# Patient Record
Sex: Female | Born: 1960
Health system: Southern US, Community
[De-identification: ages and names within clinical notes are randomized; demographics above are authoritative.]

## PROBLEM LIST (undated history)

## (undated) DIAGNOSIS — C50919 Malignant neoplasm of unspecified site of unspecified female breast: Secondary | ICD-10-CM

## (undated) DIAGNOSIS — Z9889 Other specified postprocedural states: Secondary | ICD-10-CM

## (undated) DIAGNOSIS — R112 Nausea with vomiting, unspecified: Secondary | ICD-10-CM

## (undated) DIAGNOSIS — I10 Essential (primary) hypertension: Secondary | ICD-10-CM

## (undated) DIAGNOSIS — E0500 Thyrotoxicosis with diffuse goiter without thyrotoxic crisis or storm: Secondary | ICD-10-CM

## (undated) DIAGNOSIS — N809 Endometriosis, unspecified: Secondary | ICD-10-CM

## (undated) DIAGNOSIS — Z8709 Personal history of other diseases of the respiratory system: Secondary | ICD-10-CM

## (undated) DIAGNOSIS — Z803 Family history of malignant neoplasm of breast: Principal | ICD-10-CM

## (undated) DIAGNOSIS — Z923 Personal history of irradiation: Secondary | ICD-10-CM

## (undated) DIAGNOSIS — E785 Hyperlipidemia, unspecified: Secondary | ICD-10-CM

## (undated) DIAGNOSIS — G43909 Migraine, unspecified, not intractable, without status migrainosus: Secondary | ICD-10-CM

## (undated) DIAGNOSIS — Z1401 Asymptomatic hemophilia A carrier: Secondary | ICD-10-CM

## (undated) DIAGNOSIS — Z1379 Encounter for other screening for genetic and chromosomal anomalies: Principal | ICD-10-CM

## (undated) HISTORY — DX: Family history of malignant neoplasm of breast: Z80.3

## (undated) HISTORY — DX: Personal history of other diseases of the respiratory system: Z87.09

## (undated) HISTORY — DX: Endometriosis, unspecified: N80.9

## (undated) HISTORY — DX: Migraine, unspecified, not intractable, without status migrainosus: G43.909

## (undated) HISTORY — DX: Essential (primary) hypertension: I10

## (undated) HISTORY — DX: Malignant neoplasm of unspecified site of unspecified female breast: C50.919

## (undated) HISTORY — PX: ULNAR TUNNEL RELEASE: SHX820

## (undated) HISTORY — DX: Hyperlipidemia, unspecified: E78.5

## (undated) HISTORY — DX: Encounter for other screening for genetic and chromosomal anomalies: Z13.79

## (undated) HISTORY — PX: INCISIONAL HERNIA REPAIR: SHX193

## (undated) HISTORY — DX: Thyrotoxicosis with diffuse goiter without thyrotoxic crisis or storm: E05.00

## (undated) HISTORY — DX: Asymptomatic hemophilia A carrier: Z14.01

## (undated) HISTORY — DX: Personal history of irradiation: Z92.3

---

## 1986-05-03 HISTORY — PX: VAGINAL HYSTERECTOMY: SUR661

## 1989-05-03 HISTORY — PX: ANTERIOR CRUCIATE LIGAMENT REPAIR: SHX115

## 1991-05-04 HISTORY — PX: MENISCUS REPAIR: SHX5179

## 1998-02-05 ENCOUNTER — Emergency Department (HOSPITAL_COMMUNITY): Admission: EM | Admit: 1998-02-05 | Discharge: 1998-02-06 | Payer: Self-pay | Admitting: Emergency Medicine

## 1998-02-06 ENCOUNTER — Encounter: Payer: Self-pay | Admitting: Emergency Medicine

## 1998-02-06 ENCOUNTER — Ambulatory Visit (HOSPITAL_COMMUNITY): Admission: RE | Admit: 1998-02-06 | Discharge: 1998-02-06 | Payer: Self-pay | Admitting: Emergency Medicine

## 1999-05-04 HISTORY — PX: CHOLECYSTECTOMY, LAPAROSCOPIC: SHX56

## 1999-05-04 HISTORY — PX: INCISIONAL HERNIA REPAIR: SHX193

## 2004-11-16 ENCOUNTER — Encounter: Admission: RE | Admit: 2004-11-16 | Discharge: 2004-11-16 | Payer: Self-pay | Admitting: Sports Medicine

## 2005-09-13 IMAGING — CT CT ABDOMEN W/O CM
1 series · 15 of 32 positions shown, 19 images · non-contrast
Comparison: none

CLINICAL DATA: Right lower quadrant and right flank pain.
 CT OF THE ABDOMEN WITHOUT CONTRAST ? STONE PROTOCOL:
TECHNIQUE: Multidetector helical study performed without IV and without oral contrast for the renal stone protocol.
TECHNIQUE: Multidetector CT imaging of the pelvis was performed following the standard protocol without IV contrast.

[Series 2: renal stone · axial · 0.70mm/px · z∈[-358,-38]mm · 15 of 72 slices shown, 19 images]
[im 5/72  soft-tissue]
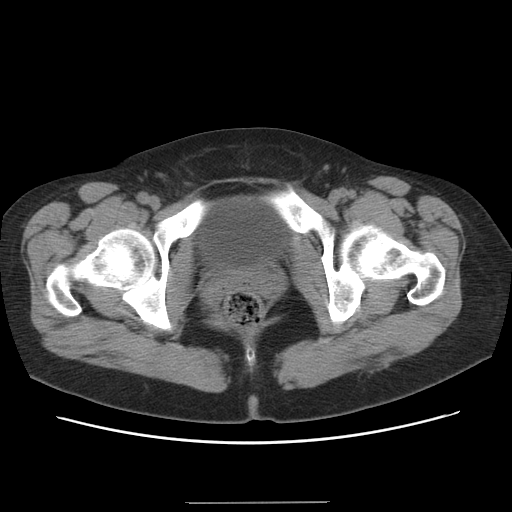
[im 5/72  bone]
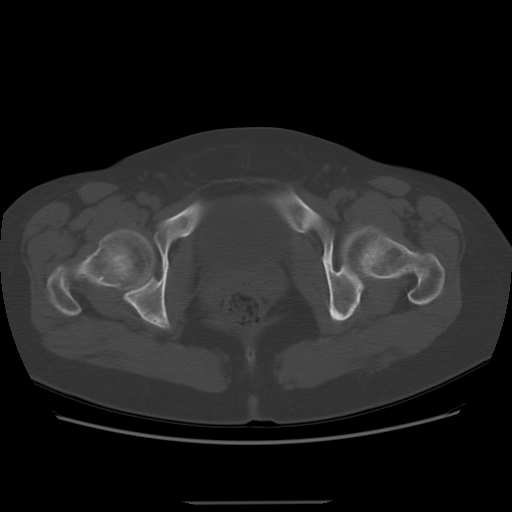
[im 10/72  soft-tissue]
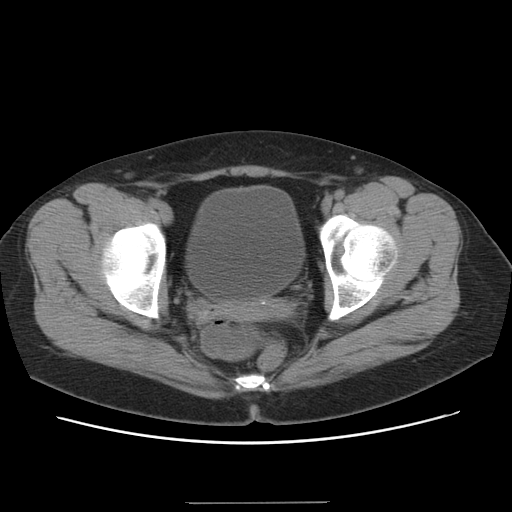
[im 14/72  soft-tissue]
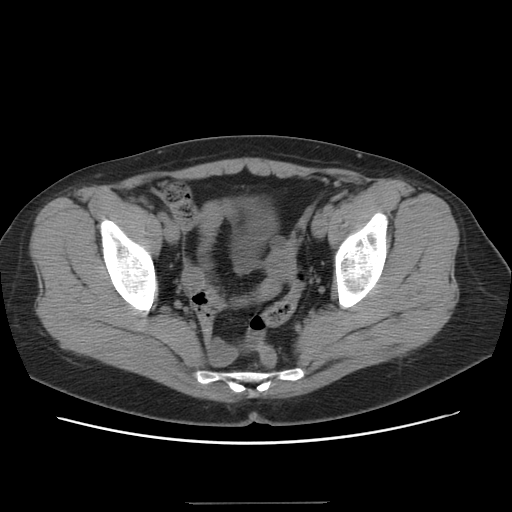
[im 21/72  soft-tissue]
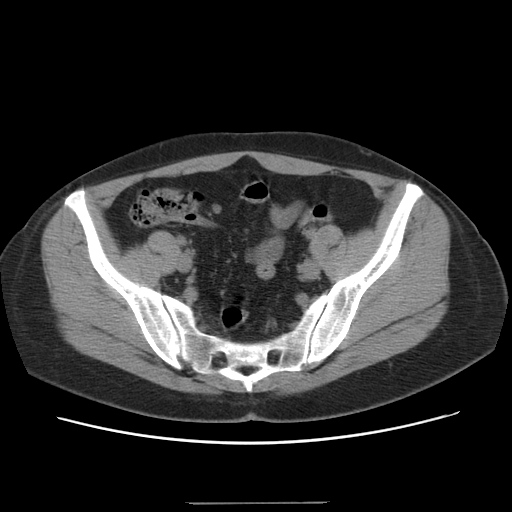
[im 26/72  soft-tissue]
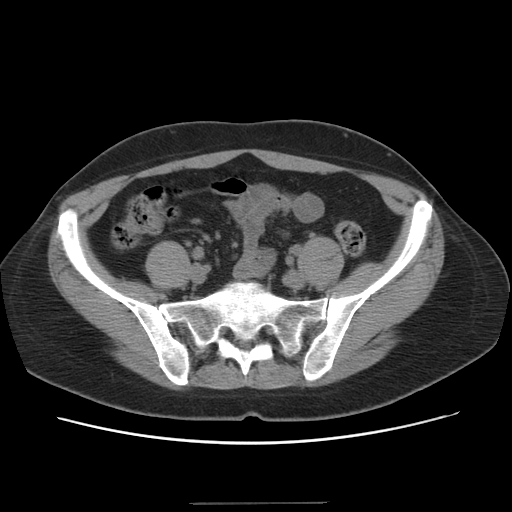
[im 30/72  soft-tissue]
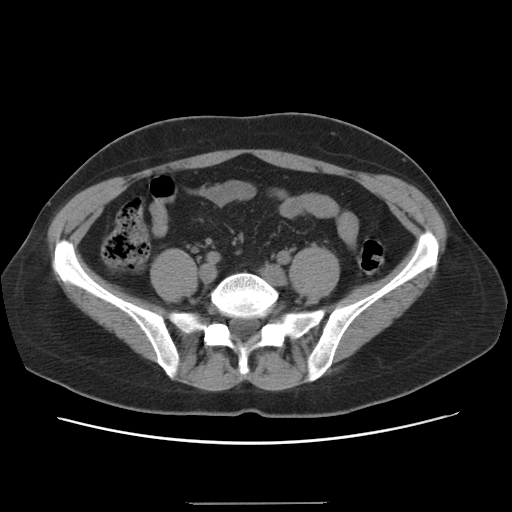
[im 37/72  soft-tissue]
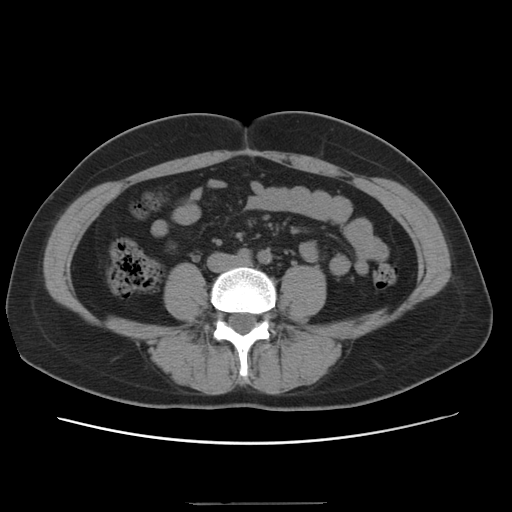
[im 42/72  soft-tissue]
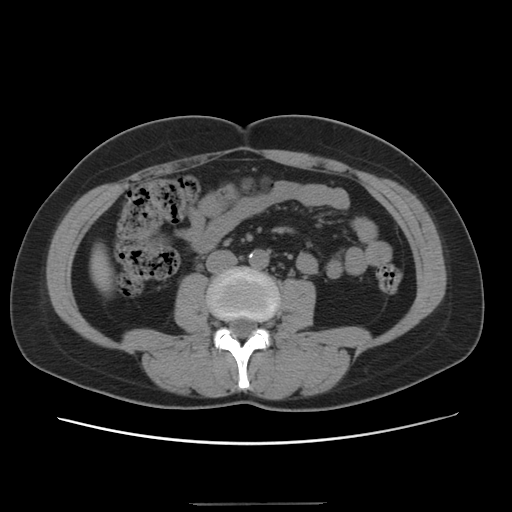
[im 46/72  soft-tissue]
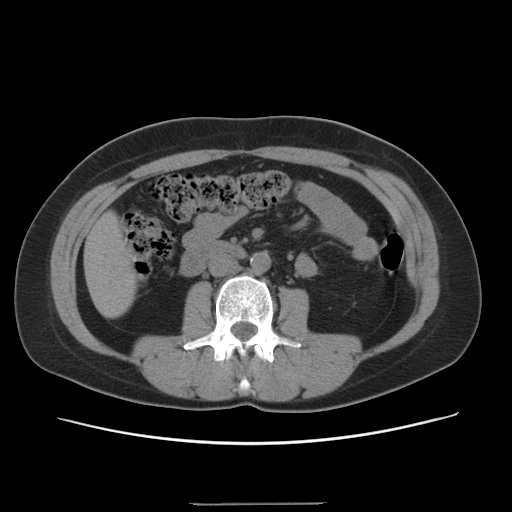
[im 46/72  bone]
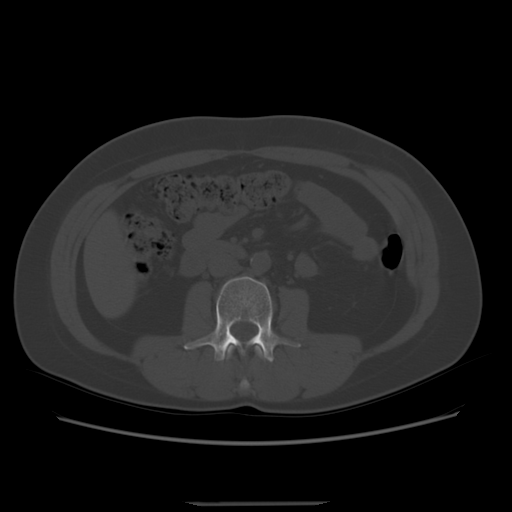
[im 51/72  soft-tissue]
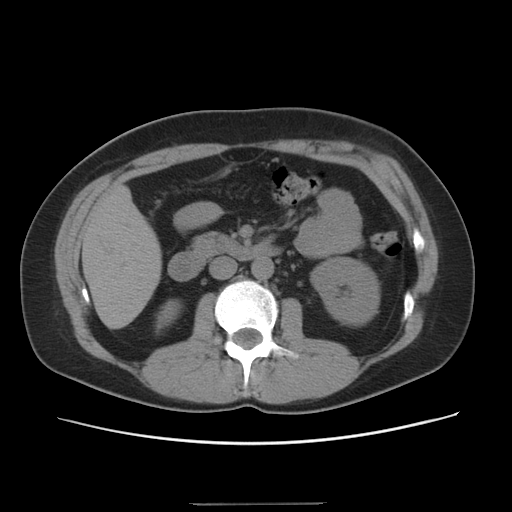
[im 58/72  soft-tissue]
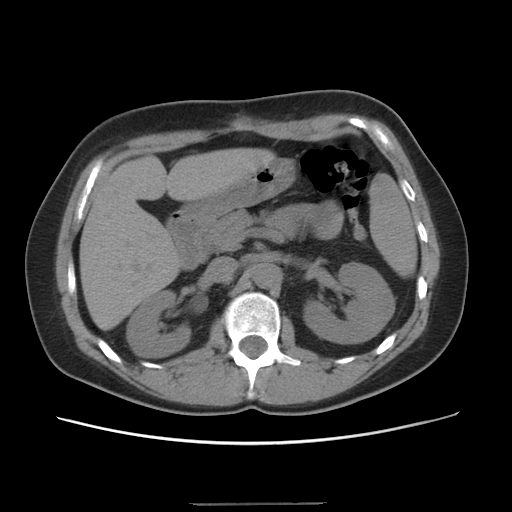
[im 62/72  soft-tissue]
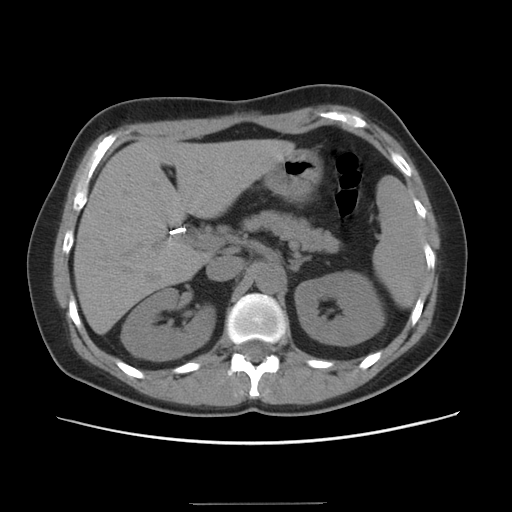
[im 62/72  lung]
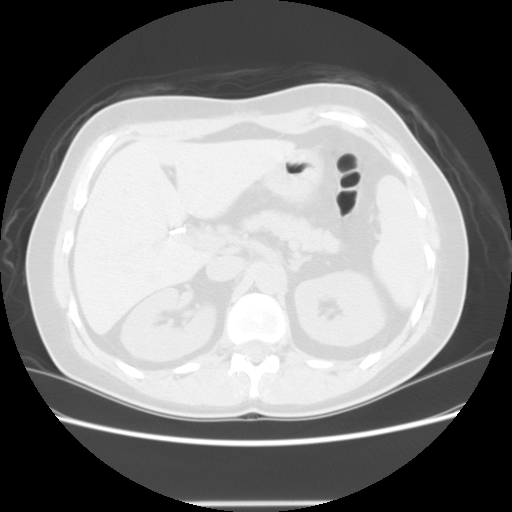
[im 65/72  lung]
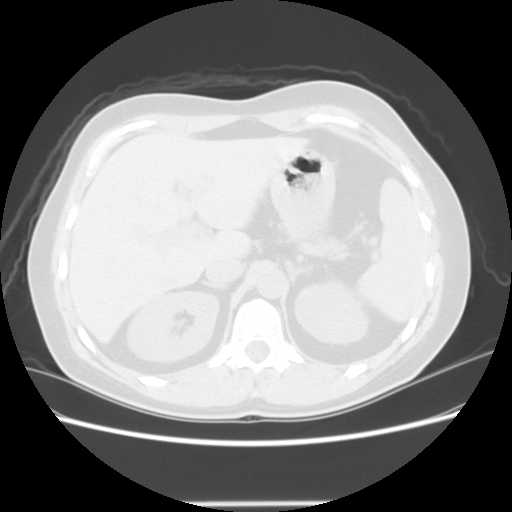
[im 67/72  soft-tissue]
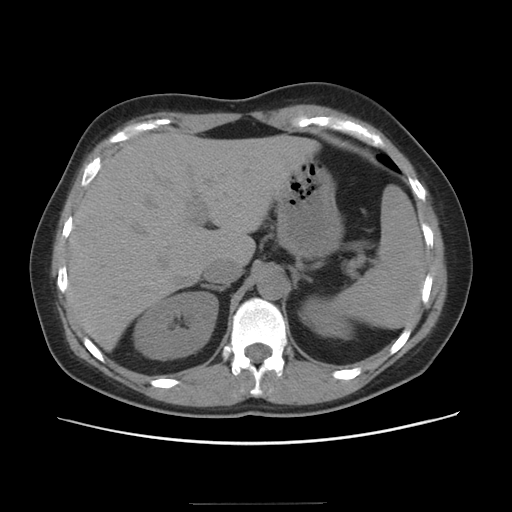
[im 67/72  lung]
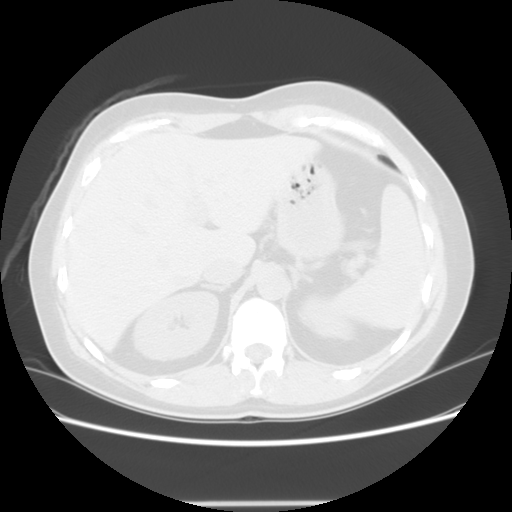
[im 69/72  lung]
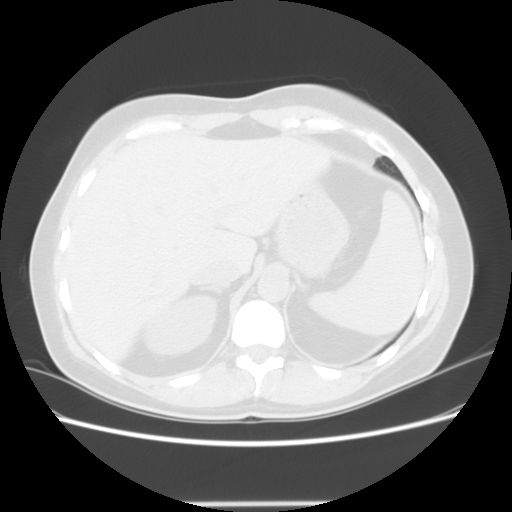

[15 of 32 positions shown; findings below may reference images not displayed]

FINDINGS: There are no infrarenal calculi and there is no hydronephrosis.  There are no perinephric edematous changes.  The gallbladder has been previously removed.  The visualized portions of the liver, spleen, pancreas and adrenal glands have a normal appearance.
IMPRESSION: Previous cholecystectomy.  No evidence for renal or ureteral calculi and no hydronephrosis. 
 CT OF THE PELVIS WITHOUT CONTRAST ? STONE PROTOCOL:
FINDINGS: There is a 2.1 cm in size right ovarian cyst noted.  There is no evidence for ureteral calculi.  There has been a previous hysterectomy.  There is no evidence for appendicitis.
IMPRESSION: 2.1 cm in size right ovarian cyst.  Previous hysterectomy.  Otherwise negative CT of the pelvis.

## 2011-08-24 LAB — HM MAMMOGRAPHY: HM Mammogram: NORMAL

## 2012-06-23 ENCOUNTER — Encounter: Payer: Self-pay | Admitting: Obstetrics and Gynecology

## 2012-07-04 ENCOUNTER — Encounter: Payer: Self-pay | Admitting: Obstetrics and Gynecology

## 2012-08-02 ENCOUNTER — Encounter: Payer: Self-pay | Admitting: Obstetrics and Gynecology

## 2012-08-02 DIAGNOSIS — Z90710 Acquired absence of both cervix and uterus: Secondary | ICD-10-CM | POA: Insufficient documentation

## 2012-08-02 DIAGNOSIS — Z1401 Asymptomatic hemophilia A carrier: Secondary | ICD-10-CM

## 2012-08-02 DIAGNOSIS — I10 Essential (primary) hypertension: Secondary | ICD-10-CM | POA: Insufficient documentation

## 2012-08-02 DIAGNOSIS — K219 Gastro-esophageal reflux disease without esophagitis: Secondary | ICD-10-CM

## 2012-08-04 ENCOUNTER — Ambulatory Visit: Payer: Self-pay | Admitting: Obstetrics and Gynecology

## 2012-08-07 ENCOUNTER — Other Ambulatory Visit: Payer: Self-pay | Admitting: Obstetrics and Gynecology

## 2012-08-07 NOTE — Telephone Encounter (Signed)
Annual exam 09/20/12

## 2012-08-15 ENCOUNTER — Other Ambulatory Visit: Payer: Self-pay | Admitting: Obstetrics and Gynecology

## 2012-08-16 ENCOUNTER — Telehealth: Payer: Self-pay | Admitting: *Deleted

## 2012-08-16 NOTE — Telephone Encounter (Signed)
Annual exam 09/20/12 with Dr. Tresa Res

## 2012-08-17 NOTE — Telephone Encounter (Signed)
Rx for Minivelle 0.05mg  2x weekly #8 with 1 refill sent to [facility] [place] . Pt has a AEX 09/20/12 cm

## 2012-09-20 ENCOUNTER — Encounter: Payer: Self-pay | Admitting: Obstetrics and Gynecology

## 2012-09-20 ENCOUNTER — Ambulatory Visit (INDEPENDENT_AMBULATORY_CARE_PROVIDER_SITE_OTHER): Payer: BC Managed Care – PPO | Admitting: Obstetrics and Gynecology

## 2012-09-20 VITALS — BP 110/62 | Ht 63.0 in | Wt 152.0 lb

## 2012-09-20 DIAGNOSIS — Z01419 Encounter for gynecological examination (general) (routine) without abnormal findings: Secondary | ICD-10-CM

## 2012-09-20 MED ORDER — ESTRADIOL 0.05 MG/24HR TD PTTW: 1.0000 | MEDICATED_PATCH | TRANSDERMAL | Status: DC

## 2012-09-20 NOTE — Progress Notes (Signed)
52 y.o.   Married    Caucasian   female   G2P2   here for annual exam.  On ERT for v-m sx relief and for mood stabilization. Stressed taking care of elderly mother.  More hot flashes.  Patch not sticking well.  May want to change to femring but wants to check price before changing.    Patient's last menstrual period was 05/03/1986.          Sexually active: yes  The current method of family planning is status post hysterectomy.    Exercising: walk 1-3 times a week Last mammogram:  08/24/11 benign Last pap smear:1996 History of abnormal pap: no Smoking:1-2 cigarettes a day Alcohol:no Last colonoscopy:never encouraged to go. Last Bone Density:  never Last tetanus shot: less than 10 years Last cholesterol check: 2013 elevated  Hgb:      pcp          Urine:pcp   Family History  Problem Relation Age of Onset  . Breast cancer Mother 73  . Diabetes Mellitus I Mother   . Breast cancer Maternal Aunt 56  . Heart attack Father     Patient Active Problem List   Diagnosis Date Noted  . H/O total vaginal hysterectomy, endometriosis 1988 08/02/2012  . GERD (gastroesophageal reflux disease) 08/02/2012  . 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 Hypertension     Past Medical History  Diagnosis Date  . Graves disease   . Personal history of asthma   . GERD (gastroesophageal reflux disease)   . 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 Hypertension   . Endometriosis     Past Surgical History  Procedure Laterality Date  . Vaginal hysterectomy  1988    for endometriosos  . Cholecystectomy, laparoscopic  2001  . Incisional hernia repair  2001    from lap chole  . Ulnar tunnel release Right     Allergies: Review of patient's allergies indicates no known allergies.  Current Outpatient Prescriptions  Medication Sig Dispense Refill  . hydrochlorothiazide (HYDRODIURIL) 25 MG tablet Take 25 mg by mouth daily.      . methimazole (TAPAZOLE) 10 MG tablet Take 10 mg by mouth daily.      . metoprolol succinate  (TOPROL-XL) 25 MG 24 hr tablet Take 25 mg by mouth daily.      Marland Kitchen MINIVELLE 0.05 MG/24HR APPLY 1 PATCH TWICE WEEKKY  8 patch  1  . rosuvastatin (CRESTOR) 10 MG tablet Take 10 mg by mouth daily. Dose uncertain      . topiramate (TOPAMAX) 25 MG tablet Take 25 mg by mouth daily. Frequency uncertain      . ZETIA 10 MG tablet        No current facility-administered medications for this visit.    ROS: Pertinent items are noted in HPI.  Social Hx: married, two children, works as an Contractor  Exam:    BP 110/62  Ht 5\' 3"  (1.6 m)  Wt 152 lb (68.947 kg)  BMI 26.93 kg/m2  LMP 05/03/1986  Ht stable and wt up 8 pounds from last year Wt Readings from Last 3 Encounters:  09/20/12 152 lb (68.947 kg)     Ht Readings from Last 3 Encounters:  09/20/12 5\' 3"  (1.6 m)    General appearance: alert, cooperative and appears stated age Head: Normocephalic, without obvious abnormality, atraumatic Neck: no adenopathy, supple, symmetrical, trachea midline and thyroid not enlarged, symmetric, no tenderness/mass/nodules Lungs: clear to auscultation bilaterally Breasts: Inspection negative, No nipple retraction or dimpling,  No nipple discharge or bleeding, No axillary or supraclavicular adenopathy, Normal to palpation without dominant masses Heart: regular rate and rhythm Abdomen: soft, non-tender; bowel sounds normal; no masses,  no organomegaly Extremities: extremities normal, atraumatic, no cyanosis or edema Skin: Skin color, texture, turgor normal. No rashes or lesions Lymph nodes: Cervical, supraclavicular, and axillary nodes normal. No abnormal inguinal nodes palpated Neurologic: Grossly normal   Pelvic: External genitalia:  no lesions              Urethra:  normal appearing urethra with no masses, tenderness or lesions              Bartholins and Skenes: normal                 Vagina: normal appearing vagina with normal color and discharge, no lesions              Cervix: absent               Pap taken: no        Bimanual Exam:  Uterus:  absent                                      Adnexa: normal adnexa in size, nontender and no masses                                      Rectovaginal: Confirms                                      Anus:  normal sphincter tone, no lesions  A: normal  menopausal exam, on ERT     S/p TVH 1988 for endometriosis - stopped P4 tx after discussion re breast ca risks in 2012     FH breast cancer in Mother and mat aunt, both post menopausal     HTN, elevated Cholesterol     P: mammogram counseled on breast self exam, mammography screening, adequate intake of calcium and vitamin D, diet and exercise return annually or prn     An After Visit Summary was printed and given to the patient.

## 2012-09-20 NOTE — Patient Instructions (Signed)

## 2012-11-14 ENCOUNTER — Telehealth: Payer: Self-pay | Admitting: *Deleted

## 2012-11-14 NOTE — Telephone Encounter (Signed)
Per Dr. Tresa Res. Mammogram out of hold/ 11/10/2012/ sue

## 2012-11-23 ENCOUNTER — Telehealth: Payer: Self-pay | Admitting: Obstetrics and Gynecology

## 2012-11-23 NOTE — Telephone Encounter (Signed)
Patient finishing up an rx for a sinus infection and thinks she may have a urinary tract infection. Patient declines appointment and hopes we will call something into [facility] for her.

## 2012-11-23 NOTE — Telephone Encounter (Signed)
Spoke with patient about her symptoms, she thinks  it might be a yeast infection not sure. I explained to her That we would need to see her in the office can not treat over the phone. She was ok with is will need to clear It with her employer. appt scheduled for 11/24/12 w/ Dr. Tresa Res. cm

## 2012-11-24 ENCOUNTER — Ambulatory Visit: Payer: BC Managed Care – PPO | Admitting: Obstetrics and Gynecology

## 2012-11-24 NOTE — Telephone Encounter (Signed)
Patient called and is unable to make the appointment for today due to [employer] at work. Did not count as DNKA. Patient does want to speak with nurse please for advice for the weekend please.

## 2012-11-24 NOTE — Telephone Encounter (Signed)
Returned call to patient who is at work. Stated she can not come in  Today due to work schedule. States she will call the doctor who Rx the antibiotic and see if they will call something in . Patient did not know the name of the antibiotic or how long she had been on it. States will call back on Monday if still has symptoms.

## 2012-12-08 ENCOUNTER — Telehealth: Payer: Self-pay | Admitting: *Deleted

## 2012-12-08 NOTE — Telephone Encounter (Signed)
Placed in 6 month recall / 04 mammogram per Patty Grubb./sue

## 2013-09-25 ENCOUNTER — Other Ambulatory Visit: Payer: Self-pay

## 2013-09-25 MED ORDER — ESTRADIOL 0.05 MG/24HR TD PTTW: 1.0000 | MEDICATED_PATCH | TRANSDERMAL | Status: DC

## 2013-09-25 NOTE — Telephone Encounter (Signed)
Call to patient-needs AEX-scheduled for 10/25/13 with Dr Sabra Heck, MMG was done 11/01/12 with additional views on 11/07/13, recommended diag mmg in one year//kn

## 2013-10-25 ENCOUNTER — Ambulatory Visit: Payer: BC Managed Care – PPO | Admitting: Obstetrics & Gynecology

## 2013-11-12 ENCOUNTER — Encounter: Payer: Self-pay | Admitting: Obstetrics & Gynecology

## 2013-11-12 ENCOUNTER — Ambulatory Visit (INDEPENDENT_AMBULATORY_CARE_PROVIDER_SITE_OTHER): Payer: BC Managed Care – PPO | Admitting: Obstetrics & Gynecology

## 2013-11-12 VITALS — BP 128/78 | HR 64 | Resp 12 | Ht 63.0 in | Wt 165.8 lb

## 2013-11-12 DIAGNOSIS — Z1211 Encounter for screening for malignant neoplasm of colon: Secondary | ICD-10-CM

## 2013-11-12 DIAGNOSIS — Z0000 Encounter for general adult medical examination without abnormal findings: Secondary | ICD-10-CM

## 2013-11-12 DIAGNOSIS — Z01419 Encounter for gynecological examination (general) (routine) without abnormal findings: Secondary | ICD-10-CM

## 2013-11-12 LAB — POCT URINALYSIS DIPSTICK
Bilirubin, UA: NEGATIVE
Blood, UA: NEGATIVE
Glucose, UA: NEGATIVE
KETONES UA: NEGATIVE
LEUKOCYTES UA: NEGATIVE
Nitrite, UA: NEGATIVE
PH UA: 5
PROTEIN UA: NEGATIVE
UROBILINOGEN UA: NEGATIVE

## 2013-11-12 MED ORDER — ESTRADIOL 0.05 MG/24HR TD PTTW: 1.0000 | MEDICATED_PATCH | TRANSDERMAL | Status: DC

## 2013-11-12 NOTE — Progress Notes (Signed)
53 y.o. G2P2 MarriedCaucasianF here for annual exam.  No vaginal bleeding.  Works in [employer].  Saw Dr. Forde Dandy last week.  Going every four months.  Having a lot of issues with this.  H/O grave's disease.  Thyroid function now low and she is on synthroid.    Patient's last menstrual period was 05/03/1986.          Sexually active: Yes.    The current method of family planning is status post hysterectomy.    Exercising: No.  not regularly Smoker:  Former smoker  Health Maintenance: Pap:  Years ago History of abnormal Pap:  no MMG:  11/01/12 3D MMG, 11/07/12 additional views-diag one year Colonoscopy:  none BMD:   none TDaP:  Up to date with work Screening Labs: Dr. Forde Dandy, Hb today: 14.8, Urine today: negative   reports that she has quit smoking. Her smoking use included Cigarettes. She smoked 0.00 packs per day. She has never used smokeless tobacco. She reports that she does not drink alcohol or use illicit drugs.  Past Medical History  Diagnosis Date  . Graves disease   . Personal history of asthma   . GERD (gastroesophageal reflux disease)   . 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 Hypertension   . Endometriosis   . Migraine     Past Surgical History  Procedure Laterality Date  . Vaginal hysterectomy  1988    for endometriosos  . Cholecystectomy, laparoscopic  2001  . Incisional hernia repair  2001    from lap chole  . Ulnar tunnel release Right     Current Outpatient Prescriptions  Medication Sig Dispense Refill  . estradiol (MINIVELLE) 0.05 MG/24HR patch Place 1 patch (0.05 mg total) onto the skin 2 (two) times a week.  8 patch  1  . hydrochlorothiazide (HYDRODIURIL) 25 MG tablet Take 25 mg by mouth daily.      . metoprolol succinate (TOPROL-XL) 25 MG 24 hr tablet Take 25 mg by mouth daily.      . rosuvastatin (CRESTOR) 10 MG tablet Take 10 mg by mouth daily. Dose uncertain      . Topiramate ER (TROKENDI XR) 25 MG CP24 Take 25 mg by mouth daily.      Marland Kitchen ZETIA 10 MG tablet        . levothyroxine (SYNTHROID, LEVOTHROID) 75 MCG tablet Take 75 mcg by mouth.       No current facility-administered medications for this visit.    Family History  Problem Relation Age of Onset  . Breast cancer Mother 76  . Diabetes Mellitus I Mother   . Breast cancer Maternal Aunt 56  . Heart attack Father     ROS:  Pertinent items are noted in HPI.  Otherwise, a comprehensive ROS was negative.  Exam:   BP 128/78  Pulse 64  Resp 12  Ht 5\' 3"  (1.6 m)  Wt 165 lb 12.8 oz (75.206 kg)  BMI 29.38 kg/m2  LMP 05/03/1986  Height: 5\' 3"  (160 cm)  Ht Readings from Last 3 Encounters:  11/12/13 5\' 3"  (1.6 m)  09/20/12 5\' 3"  (1.6 m)    General appearance: alert, cooperative and appears stated age Head: Normocephalic, without obvious abnormality, atraumatic Neck: no adenopathy, supple, symmetrical, trachea midline and thyroid normal to inspection and palpation Lungs: clear to auscultation bilaterally Breasts: normal appearance, no masses or tenderness Heart: regular rate and rhythm Abdomen: soft, non-tender; bowel sounds normal; no masses,  no organomegaly, small mobile 3cm lesion just under surface  of skin c/w lipoma Extremities: extremities normal, atraumatic, no cyanosis or edema Skin: Skin color, texture, turgor normal. No rashes or lesions Lymph nodes: Cervical, supraclavicular, and axillary nodes normal. No abnormal inguinal nodes palpated Neurologic: Grossly normal   Pelvic: External genitalia:  no lesions              Urethra:  normal appearing urethra with no masses, tenderness or lesions              Bartholins and Skenes: normal                 Vagina: normal appearing vagina with normal color and discharge, no lesions              Cervix: absent              Pap taken: No. Bimanual Exam:  Uterus:  uterus absent              Adnexa: normal adnexa and no mass, fullness, tenderness               Rectovaginal: Confirms               Anus:  normal sphincter tone, no  lesions  A:  Well Woman with normal exam S/p TVH due to endometriosis Hypertension Elevated lipids H/O graves disease, now hypothyroidism Abdominal wall lipoma below umbilicus  P:   Mammogram due.  Pt aware.  Will schedule. Declines colonoscopy.  IFOB given. Labs with Dr. Forde Dandy.  Has follow up scheduled. pap smear not indicated return annually or prn  An After Visit Summary was printed and given to the patient.

## 2013-11-13 LAB — HEMOGLOBIN, FINGERSTICK: Hemoglobin, fingerstick: 14.8 g/dL (ref 12.0–16.0)

## 2013-12-02 ENCOUNTER — Other Ambulatory Visit: Payer: Self-pay | Admitting: Obstetrics & Gynecology

## 2013-12-03 NOTE — Telephone Encounter (Signed)
Last AEX: 11/12/13 Last refill:11/12/13 #8, 12 Current AEX:  New rx sent on 11/12/13 Encounter closed

## 2014-01-16 ENCOUNTER — Encounter: Payer: Self-pay | Admitting: Obstetrics & Gynecology

## 2014-01-16 NOTE — Progress Notes (Signed)
Letter sent to remind pt she has not returned IFOB.

## 2014-02-27 NOTE — Progress Notes (Signed)
Order cancelled for fecal occult testing as pt didn't return it even after letter sent.

## 2014-03-04 ENCOUNTER — Encounter: Payer: Self-pay | Admitting: Obstetrics & Gynecology

## 2014-11-22 ENCOUNTER — Other Ambulatory Visit: Payer: Self-pay | Admitting: Obstetrics & Gynecology

## 2014-11-22 NOTE — Telephone Encounter (Signed)
Medication refill request: Minivelle 0.05 mg  Last AEX:  11/12/13 with SM  Next AEX: 11/29/14 WITH SM Last MMG (if hormonal medication request): 11/15/13 bi-rads 2: benign Refill authorized: #8/0 rfs, please advise.

## 2014-11-29 ENCOUNTER — Ambulatory Visit: Payer: BC Managed Care – PPO | Admitting: Obstetrics & Gynecology

## 2014-12-18 ENCOUNTER — Other Ambulatory Visit: Payer: Self-pay | Admitting: Obstetrics & Gynecology

## 2014-12-18 NOTE — Telephone Encounter (Signed)
Medication refill request: Minivelle 0.05 mg  Last AEX:  11/12/13 with SM  Next AEX: 03/02/16 with SM  Last MMG (if hormonal medication request): 11/15/13 breast den category c: bi-rads 2: benign Refill authorized: Please advise  (routed to Dr. Quincy Simmonds since Dr. Sabra Heck is out of the office)

## 2014-12-18 NOTE — Telephone Encounter (Signed)
Called patient and offered to reschedule appointment, patient was offered multiple other options but patient declined due to work schedule and needing first appointment of the day only.Patient is already on waiting list and she is aware that someone will call her if appointment opens up sooner. I called patient back and left message on her voicemail in regards to scheduling mammogram.

## 2014-12-18 NOTE — Telephone Encounter (Signed)
Routed to Dr. Silva. 

## 2014-12-18 NOTE — Telephone Encounter (Signed)
I will give refill for one month.  Needs annual exam moved up and mammogram done.

## 2015-01-17 ENCOUNTER — Other Ambulatory Visit: Payer: Self-pay | Admitting: Obstetrics and Gynecology

## 2015-01-17 NOTE — Telephone Encounter (Signed)
Medication refill request: Minivelle 0.005mg  patch Last AEX:  11/12/13 SM Next AEX: 03/02/16 SM Last MMG (if hormonal medication request): 11/15/13 BIRADS2:Benign  Refill authorized: 12/18/14 #[id number] by Dr Quincy Simmonds  Refill encounter from 12/18/14:                     Alfonzo Feller, [place] at 12/18/2014 4:35 PM     Status: Signed       Expand All Collapse All   Called patient and offered to reschedule appointment, patient was offered multiple other options but patient declined due to work schedule and needing first appointment of the day only.Patient is already on waiting list and she is aware that someone will call her if appointment opens up sooner. I called patient back and left message on her voicemail in regards to scheduling mammogram.       Today please advise.

## 2015-02-19 ENCOUNTER — Other Ambulatory Visit: Payer: Self-pay | Admitting: Obstetrics & Gynecology

## 2015-02-19 NOTE — Telephone Encounter (Signed)
Medication refill request: Minivelle Patch Last AEX:  11-30-13  Next AEX: 03-03-15 Last MMG (if hormonal medication request): 12-31-14 WNL  Refill authorized: please advise

## 2015-03-18 ENCOUNTER — Other Ambulatory Visit: Payer: Self-pay | Admitting: Obstetrics & Gynecology

## 2015-03-18 ENCOUNTER — Ambulatory Visit (INDEPENDENT_AMBULATORY_CARE_PROVIDER_SITE_OTHER): Payer: BLUE CROSS/BLUE SHIELD | Admitting: Obstetrics & Gynecology

## 2015-03-18 ENCOUNTER — Encounter: Payer: Self-pay | Admitting: Obstetrics & Gynecology

## 2015-03-18 VITALS — BP 148/78 | HR 72 | Resp 16 | Ht 63.0 in | Wt 160.0 lb

## 2015-03-18 DIAGNOSIS — Z01419 Encounter for gynecological examination (general) (routine) without abnormal findings: Secondary | ICD-10-CM | POA: Diagnosis not present

## 2015-03-18 DIAGNOSIS — Z1211 Encounter for screening for malignant neoplasm of colon: Secondary | ICD-10-CM | POA: Diagnosis not present

## 2015-03-18 DIAGNOSIS — Z124 Encounter for screening for malignant neoplasm of cervix: Secondary | ICD-10-CM

## 2015-03-18 DIAGNOSIS — Z0000 Encounter for general adult medical examination without abnormal findings: Secondary | ICD-10-CM

## 2015-03-18 LAB — POCT URINALYSIS DIPSTICK
Bilirubin, UA: NEGATIVE
Blood, UA: NEGATIVE
Glucose, UA: NEGATIVE
KETONES UA: NEGATIVE
LEUKOCYTES UA: NEGATIVE
NITRITE UA: NEGATIVE
PH UA: 5
PROTEIN UA: NEGATIVE
Urobilinogen, UA: NEGATIVE

## 2015-03-18 MED ORDER — ESTRADIOL 0.05 MG/24HR TD PTTW: 1.0000 | MEDICATED_PATCH | TRANSDERMAL | Status: DC

## 2015-03-18 NOTE — Progress Notes (Signed)
54 y.o. G2P2 MarriedCaucasianF here for annual exam.  Doing well.  No vaginal bleeding.  Blood work done with Dr. Forde Dandy.  Getting results today.    Mother diagnosed with aggressive stage 3 lung cancer.  She is undergoing radiation.  Pt is having a lot of stress with this.  Pt trying to work and take mother to radiation daily.  Patient's last menstrual period was 05/03/1986.          Sexually active: Yes.    The current method of family planning is status post hysterectomy.    Exercising: No.  not regularly Smoker:  Former smoker  Health Maintenance: Pap:  ? 2010-checking with [facility] History of abnormal Pap:  no MMG:  12/31/14 BiRads 2-benign Colonoscopy:  none BMD:   none TDaP:  UTD with work Screening Labs: PCP, Hb today: PCP, Urine today: negative   reports that she has quit smoking. Her smoking use included Cigarettes. She has never used smokeless tobacco. She reports that she does not drink alcohol or use illicit drugs.  Past Medical History  Diagnosis Date  . Graves disease   . Personal history of asthma   . GERD (gastroesophageal reflux disease)   . 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 Hypertension   . Endometriosis   . Migraine     Past Surgical History  Procedure Laterality Date  . Vaginal hysterectomy  1988    for endometriosos  . Cholecystectomy, laparoscopic  2001  . Incisional hernia repair  2001    from lap chole  . Ulnar tunnel release Right     Current Outpatient Prescriptions  Medication Sig Dispense Refill  . hydrochlorothiazide (HYDRODIURIL) 25 MG tablet Take 25 mg by mouth daily.    . metoprolol succinate (TOPROL-XL) 25 MG 24 hr tablet Take 25 mg by mouth daily.    Marland Kitchen MINIVELLE 0.05 MG/24HR patch APPLY 1 PATCH ONTO THE SKIN 2 TIMES A WEEK 8 patch 0  . rosuvastatin (CRESTOR) 10 MG tablet Take 10 mg by mouth daily. Dose uncertain    . SYNTHROID 100 MCG tablet   4  . ZETIA 10 MG tablet      No current facility-administered medications for this visit.    Family  History  Problem Relation Age of Onset  . Breast cancer Mother 6  . Diabetes Mellitus I Mother   . Breast cancer Maternal Aunt 56  . Heart attack Father     ROS:  Pertinent items are noted in HPI.  Otherwise, a comprehensive ROS was negative.  Exam:   BP 148/78 mmHg  Pulse 72  Resp 16  Ht 5\' 3"  (1.6 m)  Wt 160 lb (72.576 kg)  BMI 28.35 kg/m2  LMP 05/03/1986   Height: 5\' 3"  (160 cm)  Ht Readings from Last 3 Encounters:  03/18/15 5\' 3"  (1.6 m)  11/12/13 5\' 3"  (1.6 m)  09/20/12 5\' 3"  (1.6 m)    General appearance: alert, cooperative and appears stated age Head: Normocephalic, without obvious abnormality, atraumatic Neck: no adenopathy, supple, symmetrical, trachea midline and thyroid normal to inspection and palpation Lungs: clear to auscultation bilaterally Breasts: normal appearance, no masses or tenderness Heart: regular rate and rhythm Abdomen: soft, non-tender; bowel sounds normal; no masses,  no organomegaly, about 2.5cm hernia beneath sternum, sebaceous cyst (2cm) beneath umbilicus Extremities: extremities normal, atraumatic, no cyanosis or edema Skin: Skin color, texture, turgor normal. No rashes or lesions Lymph nodes: Cervical, supraclavicular, and axillary nodes normal. No abnormal inguinal nodes palpated Neurologic: Grossly normal  Pelvic: External genitalia:  no lesions              Urethra:  normal appearing urethra with no masses, tenderness or lesions              Bartholins and Skenes: normal                 Vagina: normal appearing vagina with normal color and discharge, no lesions              Cervix: absent              Pap taken: Yes.   Bimanual Exam:  Uterus:  uterus absent              Adnexa: normal adnexa and no mass, fullness, tenderness               Rectovaginal: Confirms               Anus:  normal sphincter tone, no lesions  Chaperone was present for exam.  A:  Well Woman with normal exam S/p TVH due to  endometriosis Hypertension Elevated lipids H/O graves disease, now hypothyroidism Hernia just beneath sternum Sebaceous cyst beneath umbilicus Social stressors with mother's diagnosis  P: Mammogram yearly.  3D discussed due breast density Declines colonoscopy now due to issues with mother. IFOB given. Labs with Dr. Forde Dandy. Has appt today. Pap smear obtained. rx for Vivelle dot 0.05mg  patch to skin twice weekly.  Generic rx given.  #8/13RF.  Pt aware of risks of DVT, PE, stroke, MI, breast cancer and she DOES NOT want to stop. return annually or prn

## 2015-03-19 LAB — IPS PAP TEST WITH REFLEX TO HPV

## 2015-06-23 ENCOUNTER — Encounter: Payer: Self-pay | Admitting: Obstetrics & Gynecology

## 2015-07-04 NOTE — Progress Notes (Signed)
Pt did not return IFOB.  Letter was sent as reminder.

## 2016-01-27 ENCOUNTER — Encounter: Payer: Self-pay | Admitting: Obstetrics & Gynecology

## 2016-03-02 ENCOUNTER — Ambulatory Visit: Payer: Self-pay | Admitting: Obstetrics & Gynecology

## 2016-03-08 ENCOUNTER — Other Ambulatory Visit: Payer: Self-pay

## 2016-03-08 NOTE — Telephone Encounter (Signed)
Medication refill request: Estradiol 0.05mg  patch Last AEX:  03/18/15 SM Next AEX: 03/24/16 Last MMG (if hormonal medication request): 01/27/16 - Right diagnostic MMG BI-RADS 3 probably benign Refill authorized: 03/08/15 #8 w/13 refills; today please advise; pharmacy requesting 90-day supply

## 2016-03-10 MED ORDER — ESTRADIOL 0.05 MG/24HR TD PTTW: 1.0000 | MEDICATED_PATCH | TRANSDERMAL | 0 refills | Status: DC

## 2016-03-23 NOTE — Progress Notes (Signed)
55 y.o. G2P2 MarriedCaucasianF here for annual exam.  Denies vaginal bleeding.  Reports lots of stressors in life with care for mother.  Now daughter with three kids living with her.      PCP:  Saw Dr. Forde Dandy earlier this month.  Blood work was done.  Thyroid function is low so adjustments were made.    Patient's last menstrual period was 05/03/1986.          Sexually active: No.  The current method of family planning is status post hysterectomy.    Exercising: No.  The patient does not participate in regular exercise at present. Smoker:  no  Health Maintenance: Pap:  03/18/15 Neg  History of abnormal Pap:  no MMG:  01/14/16 Diagnostic Right BIRADS3: Probably Benign. 6 month f/u MMG recommended.  Colonoscopy: Never, stool test for blood done by Dr. Forde Dandy per pt BMD:   Never TDaP:  UTD  Pneumonia vaccine(s):  No Zostavax:   No Hep C testing:  Done today Screening Labs: PCP, Urine today:   reports that she has quit smoking. Her smoking use included Cigarettes. She has never used smokeless tobacco. She reports that she does not drink alcohol or use drugs.  Past Medical History:  Diagnosis Date  . Endometriosis   . GERD (gastroesophageal reflux disease)   . Graves disease   . 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 Hypertension   . Migraine   . Personal history of asthma     Past Surgical History:  Procedure Laterality Date  . CHOLECYSTECTOMY, LAPAROSCOPIC  2001  . [place]  2001   from lap chole  . ULNAR TUNNEL RELEASE Right   . VAGINAL HYSTERECTOMY  1988   for endometriosos    Current Outpatient Prescriptions  Medication Sig Dispense Refill  . estradiol (VIVELLE-DOT) 0.05 MG/24HR patch Place 1 patch (0.05 mg total) onto the skin 2 (two) times a week. 8 patch 0  . hydrochlorothiazide (HYDRODIURIL) 25 MG tablet Take 25 mg by mouth daily.    . metoprolol succinate (TOPROL-XL) 25 MG 24 hr tablet Take 25 mg by mouth daily.    . rosuvastatin (CRESTOR) 10 MG tablet Take 10 mg by  mouth daily. Dose uncertain    . SYNTHROID 125 MCG tablet Take 125 mcg by mouth daily.  6  . ZETIA 10 MG tablet      No current facility-administered medications for this visit.     Family History  Problem Relation Age of Onset  . Breast cancer Mother 15  . Diabetes Mellitus I Mother   . Heart attack Father   . Breast cancer Maternal Aunt 56    ROS:  Pertinent items are noted in HPI.  Otherwise, a comprehensive ROS was negative.  Exam:   BP 140/90 (BP Location: Right Arm, Patient Position: Sitting, Cuff Size: Large)   Pulse 94   Resp 16   Ht 5\' 3"  (1.6 m)   Wt 162 lb (73.5 kg)   LMP 05/03/1986   BMI 28.70 kg/m   Weight change: -2#  Height: 5\' 3"  (160 cm)  Ht Readings from Last 3 Encounters:  03/24/16 5\' 3"  (1.6 m)  03/18/15 5\' 3"  (1.6 m)  11/12/13 5\' 3"  (1.6 m)   General appearance: alert, cooperative and appears stated age, tremor of head noted today Head: Normocephalic, without obvious abnormality, atraumatic Neck: no adenopathy, supple, symmetrical, trachea midline and thyroid normal to inspection and palpation Lungs: clear to auscultation bilaterally Breasts: normal appearance, no masses or tenderness Heart:  regular rate and rhythm Abdomen: soft, non-tender; bowel sounds normal; no masses,  no organomegaly, 6-7cm incisional hernia beneath sternum noted Extremities: extremities normal, atraumatic, no cyanosis or edema Skin: Skin color, texture, turgor normal. No rashes or lesions Lymph nodes: Cervical, supraclavicular, and axillary nodes normal. No abnormal inguinal nodes palpated Neurologic: Grossly normal  Pelvic: External genitalia:  no lesions              Urethra:  normal appearing urethra with no masses, tenderness or lesions              Bartholins and Skenes: normal                 Vagina: normal appearing vagina with normal color and discharge, no lesions              Cervix: absent              Pap taken: No. Bimanual Exam:  Uterus:  uterus absent               Adnexa: normal adnexa and no mass, fullness, tenderness               Rectovaginal: Confirms               Anus:  normal sphincter tone, no lesions  Chaperone was present for exam.  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  D/W pt findings today.  She feels this has happened in the past when her thyroid dosing has not been correct but is much worse.  P: Mammogram yearly.  3D discussed due breast density Declines colonoscopy.  Knows I feel this is very important.  States guaiac testing was done with Dr. Forde Dandy. Labs/vaccines done with Dr. Forde Dandy Pap smear not indicated this year. rx for Vivelle dot 0.05mg  patch to skin twice weekly.  Generic rx given.  #8/13RF.  Pt aware of risks of DVT, PE, stroke, MI, breast cancer and she DOES NOT want to stop, especially considering social stressors Referral will be made to neurology  Hep C antibody will be obtained today Return annually or prn  ~10 minutes spent with patient in face to face discussion of discussion particularly related to head tremor, possible relation to thyroid disease, and pt's concern about other neurological causes as well as referral.

## 2016-03-24 ENCOUNTER — Encounter: Payer: Self-pay | Admitting: Obstetrics & Gynecology

## 2016-03-24 ENCOUNTER — Ambulatory Visit (INDEPENDENT_AMBULATORY_CARE_PROVIDER_SITE_OTHER): Payer: BLUE CROSS/BLUE SHIELD | Admitting: Obstetrics & Gynecology

## 2016-03-24 VITALS — BP 140/90 | HR 94 | Resp 16 | Ht 63.0 in | Wt 162.0 lb

## 2016-03-24 DIAGNOSIS — Z0000 Encounter for general adult medical examination without abnormal findings: Secondary | ICD-10-CM | POA: Diagnosis not present

## 2016-03-24 DIAGNOSIS — G250 Essential tremor: Secondary | ICD-10-CM | POA: Diagnosis not present

## 2016-03-24 DIAGNOSIS — Z01419 Encounter for gynecological examination (general) (routine) without abnormal findings: Secondary | ICD-10-CM

## 2016-03-24 DIAGNOSIS — Z205 Contact with and (suspected) exposure to viral hepatitis: Secondary | ICD-10-CM | POA: Diagnosis not present

## 2016-03-24 LAB — POCT URINALYSIS DIPSTICK
BILIRUBIN UA: NEGATIVE
Blood, UA: NEGATIVE
GLUCOSE UA: NEGATIVE
Ketones, UA: NEGATIVE
LEUKOCYTES UA: NEGATIVE
NITRITE UA: NEGATIVE
Protein, UA: NEGATIVE
UROBILINOGEN UA: NEGATIVE
pH, UA: 7

## 2016-03-24 MED ORDER — ESTRADIOL 0.05 MG/24HR TD PTTW: 1.0000 | MEDICATED_PATCH | TRANSDERMAL | 4 refills | Status: DC

## 2016-03-25 LAB — HEPATITIS C ANTIBODY: HCV AB: NEGATIVE

## 2016-05-04 NOTE — Progress Notes (Signed)
Subjective:   Michelle Fernandez was seen in consultation in the movement disorder clinic at the request of Dr. Sabra Heck.  Her PCP is Sheela Stack, MD.  The evaluation is for head tremor.  Tremor started approximately a few years ago ago.   Tremor is most noticeable when she she is stressed.  States that she has graves disease dx when she was in her 21's and in remission for 4 years.  She thinks that tremor is related to that as tremor used to be worse.   There is no family hx of tremor.  Had neuroimaging but that was in 20's.  Was done for migraine, which she still has occasionally.  Doesn't notice difficulty with turning head when driving.    Affected by caffeine:  No.  Affected by alcohol:  Doesn't drink Affected by stress:  Yes.   Affected by fatigue:  No. Spills soup if on spoon:  No. Spills glass of liquid if full:  No. Affects ADL's (tying shoes, brushing teeth, etc):  No.  Current/Previously tried tremor medications: n/a  Current medications that may exacerbate tremor:  N/a  Outside reports reviewed: historical medical records and referral letter/letters.  No Known Allergies  Outpatient Encounter Prescriptions as of 05/05/2016  Medication Sig  . estradiol (VIVELLE-DOT) 0.05 MG/24HR patch Place 1 patch (0.05 mg total) onto the skin 2 (two) times a week.  . hydrochlorothiazide (HYDRODIURIL) 25 MG tablet Take 25 mg by mouth daily.  . metoprolol succinate (TOPROL-XL) 25 MG 24 hr tablet Take 25 mg by mouth daily.  . rosuvastatin (CRESTOR) 10 MG tablet Take 10 mg by mouth daily. Dose uncertain  . SYNTHROID 125 MCG tablet Take 125 mcg by mouth daily.  Marland Kitchen ZETIA 10 MG tablet    No facility-administered encounter medications on file as of 05/05/2016.     Past Medical History:  Diagnosis Date  . Endometriosis   . GERD (gastroesophageal reflux disease)   . Graves disease   . 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 Hypertension   . Migraine   . Personal history of asthma     Past Surgical  History:  Procedure Laterality Date  . ANTERIOR CRUCIATE LIGAMENT REPAIR Left 1991  . CHOLECYSTECTOMY, LAPAROSCOPIC  2001  . [place]  2001   from lap chole  . MENISCUS REPAIR Left 1993  . ULNAR TUNNEL RELEASE Right   . VAGINAL HYSTERECTOMY  1988   for endometriosos    Social History   Social History  . Marital status: Married    Spouse name: N/A  . Number of children: N/A  . Years of education: N/A   Occupational History  . Scientist, clinical (histocompatibility and immunogenetics)    Social History Main Topics  . Smoking status: Former Smoker    Types: Cigarettes    Quit date: 05/05/2006  . Smokeless tobacco: Never Used  . Alcohol use No  . Drug use: No  . Sexual activity: Yes    Partners: Male    Birth control/ protection: Surgical     Comment: TVH   Other Topics Concern  . Not on file   Social History Narrative  . No narrative on file    Family Status  Relation Status  . Mother Alive  . Father Deceased  . Maternal Aunt   . Sister Alive   x3  . Daughter Alive   x2  . Child Alive    Review of Systems A complete 10 system ROS was obtained and was negative apart from what  is mentioned.   Objective:   VITALS:   Vitals:   05/05/16 0812  BP: 132/76  Pulse: 82  SpO2: 98%  Weight: 162 lb (73.5 kg)  Height: 5\' 3"  (1.6 m)   Gen:  Appears stated age and in NAD. HEENT:  Normocephalic, atraumatic. The mucous membranes are moist. The superficial temporal arteries are without ropiness or tenderness.  Head is turned to the right and mildly SB left.  She has tremor when turned to the left.  She exhibits geste antagoniste and holds her hand up to the chin.   Cardiovascular: Regular rate and rhythm. Lungs: Clear to auscultation bilaterally. Neck: There are no carotid bruits noted bilaterally.  NEUROLOGICAL:  Orientation:  The patient is alert and oriented x 3.  Recent and remote memory are intact.  Attention span and concentration are normal.  Able to name objects and repeat  without trouble.  Fund of knowledge is appropriate Cranial nerves: There is good facial symmetry. The pupils are equal round and reactive to light bilaterally. Fundoscopic exam reveals clear disc margins bilaterally. Extraocular muscles are intact and visual fields are full to confrontational testing. Speech is fluent and clear. Soft palate rises symmetrically and there is no tongue deviation. Hearing is intact to conversational tone. Tone: Tone is good throughout. Sensation: Sensation is intact to light touch (facial, trunk, extremities). Vibration is intact at the bilateral big toe. There is no extinction with double simultaneous stimulation. There is no sensory dermatomal level identified. Coordination:  The patient has no dysdiadichokinesia or dysmetria. Motor: Strength is 5/5 in the bilateral upper and lower extremities.  Shoulder shrug is equal bilaterally.  There is no pronator drift.  There are no fasciculations noted. DTR's: Deep tendon reflexes are 2/4 at the bilateral biceps, triceps, brachioradialis, patella and achilles.  Plantar responses are downgoing bilaterally. Gait and Station: The patient is able to ambulate without difficulty. The patient is able to heel toe walk without any difficulty. The patient is able to ambulate in a tandem fashion.   MOVEMENT EXAM: Tremor:  There is mild tremor in the UE, noted most significantly with action.  There is no tremor at rest.       Assessment/Plan:   1.  Cervical Dystonia  -I talked to the patient about the nature and pathophysiology..  The primary muscles involved are the left SCM and right splenius capitus.  We talked about treatments.  We talked about the value of botox.  The patient was educated on the botulinum toxin the black blox warning and given a copy of the botox patient medication guide.  The patient understands that this warning states that there have been reported cases of the Botox extending beyond the injection site and creating  adverse effects, similar to those of botulism. This included loss of strength, trouble walking, hoarseness, trouble saying words clearly, loss of bladder control, trouble breathing, trouble swallowing, diplopia, blurry vision and ptosis. Most of the distant spread of Botox was happening in patients, primarily children, who received medication for spasticity or for cervical dystonia. She was given literature on cervical dystonia and will think about whether or not to do anything about it right now.    -pt does have a small degree of hand tremor, which she states was much worse years ago when Graves disease was out of control.  Reassurance provided that this is not PD, as she was worried about that.  I did tell her that Botox would not help those.  Told her we  could use a small amount of primidone, but this is really not bothersome to her and we decided to hold off on that.  -The patient did ask if the Botox used for cervical dystonia would help her migraine.  I told her that while migraine does respond to Botox, it is not placed in the same area as that for cervical dystonia.  However, it is conceivable that the cervical dystonia could be setting off some of her migraines (cervicogenic migraine).  2.  She will follow-up with me as needed and will let me know if she decides to proceed with Botox.  Greater than 50% of the 60 minute visit was spent in counseling answering questions.    CC:  Sheela Stack, MD

## 2016-05-05 ENCOUNTER — Encounter: Payer: Self-pay | Admitting: Neurology

## 2016-05-05 ENCOUNTER — Ambulatory Visit (INDEPENDENT_AMBULATORY_CARE_PROVIDER_SITE_OTHER): Payer: BLUE CROSS/BLUE SHIELD | Admitting: Neurology

## 2016-05-05 VITALS — BP 132/76 | HR 82 | Ht 63.0 in | Wt 162.0 lb

## 2016-05-05 DIAGNOSIS — G243 Spasmodic torticollis: Secondary | ICD-10-CM | POA: Insufficient documentation

## 2016-05-05 DIAGNOSIS — R251 Tremor, unspecified: Secondary | ICD-10-CM | POA: Diagnosis not present

## 2016-05-05 DIAGNOSIS — G43809 Other migraine, not intractable, without status migrainosus: Secondary | ICD-10-CM

## 2016-07-15 ENCOUNTER — Ambulatory Visit: Payer: BLUE CROSS/BLUE SHIELD | Admitting: Obstetrics & Gynecology

## 2016-08-04 ENCOUNTER — Telehealth: Payer: Self-pay | Admitting: *Deleted

## 2016-08-04 NOTE — Telephone Encounter (Signed)
Patient in 04 recall for 07/2016 for right breast calcifcation, F/U MMG in 6 month. Please contact patient regarding recall scheduling

## 2016-08-05 NOTE — Telephone Encounter (Signed)
Report received from [place]. Patient had follow up on 07/20/16.   Report given to Dr. Sabra Heck for review.

## 2016-08-05 NOTE — Telephone Encounter (Signed)
Patient had 6 month follow-up with [facility] on 07/30/16. Report is being faxed.

## 2017-02-01 ENCOUNTER — Other Ambulatory Visit: Payer: Self-pay | Admitting: Radiology

## 2017-02-03 ENCOUNTER — Encounter: Payer: Self-pay | Admitting: *Deleted

## 2017-02-03 ENCOUNTER — Telehealth: Payer: Self-pay | Admitting: Oncology

## 2017-02-03 ENCOUNTER — Other Ambulatory Visit: Payer: Self-pay | Admitting: *Deleted

## 2017-02-03 DIAGNOSIS — D0511 Intraductal carcinoma in situ of right breast: Secondary | ICD-10-CM | POA: Insufficient documentation

## 2017-02-03 NOTE — Telephone Encounter (Signed)
Confirmed 10/10 [facility] appointment with patient, this is a [facility] patient so no paperwork sent, but did send a letter of appointment reminder

## 2017-02-07 ENCOUNTER — Encounter: Payer: Self-pay | Admitting: Oncology

## 2017-02-07 NOTE — Progress Notes (Signed)
[name]  [phone] [phone]     ID: Michelle Fernandez DOB: [birth year]  MR#: [id number]  NFA#:[id number]  Patient Care Team: Reynold Bowen, MD as PCP - General (Endocrinology) Rolm Bookbinder, MD as Consulting Physician (General Surgery) Attallah Ontko, Virgie Dad, MD as Consulting Physician (Oncology) Gery Pray, MD as Consulting Physician (Radiation Oncology) OTHER MD:  CHIEF COMPLAINT: Ductal carcinoma in situ  CURRENT TREATMENT: Awaiting definitive surgery   HISTORY OF CURRENT ILLNESS: Michelle Fernandez had routine screening mammography at [facility] 01/14/2016 showing an area of grouped calcifications in the central right breast. She was set up for six-month follow-up 07/20/2016 and this showed them to be stable, and probably benign, but repeat 6 month follow-up was recommended and on 01/25/2017 the patient underwent bilateral diagnostic mammography showing the breast density to be category C. In the right breast retroareolar area there was a new group of amorphous calcifications measuring 0.6 cm. Biopsy of this area 02/01/2017 showed ([id number]) ductal carcinoma in situ, intermediate grade, estrogen and progesterone receptor positive.  Right axillary ultrasound 02/01/2017 was sonographically benign  The patient's subsequent history is as detailed below.  INTERVAL HISTORY: Michelle Fernandez was evaluated in the multidisciplinary breast cancer clinic 02/09/2017 accompanied by her daughters broke and Hollie. Her case was also presented at the multidisciplinary breast cancer conference on the same day. At that time a preliminary plan was proposed: Consideration of the COMET trial, otherwise breast conserving surgery, adjuvant radiation, prophylactic anti-estrogens, and genetics referral    REVIEW OF SYSTEMS: There were no specific symptoms leading to the original mammogram, which was routinely scheduled. The patient denies unusual headaches, visual changes,  nausea, vomiting, stiff neck, dizziness, or gait imbalance. There has been no cough, phlegm production, or pleurisy, no chest pain or pressure, and no change in bowel or bladder habits. The patient denies fever, rash, bleeding, unexplained fatigue or unexplained weight loss. A detailed review of systems was otherwise entirely negative.   PAST MEDICAL HISTORY: Past Medical History:  Diagnosis Date  . Endometriosis   . GERD (gastroesophageal reflux disease)   . Graves disease   . 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 Hypertension   . Migraine   . Personal history of asthma     PAST SURGICAL HISTORY: Past Surgical History:  Procedure Laterality Date  . ANTERIOR CRUCIATE LIGAMENT REPAIR Left 1991  . CHOLECYSTECTOMY, LAPAROSCOPIC  2001  . [place]  2001   from lap chole  . MENISCUS REPAIR Left 1993  . ULNAR TUNNEL RELEASE Right   . VAGINAL HYSTERECTOMY  1988   for endometriosos    FAMILY HISTORY Family History  Problem Relation Age of Onset  . Breast cancer Mother 9  . Diabetes Mellitus I Mother   . Lung cancer Mother   . Dementia Mother   . Heart attack Father   . Breast cancer Maternal Aunt 78  Her father died at age 39 from a MI. Her mother is 60 years old as of October 2018. The patient's mother has a history of lung cancer diagnosed at age 33 and breast cancer diagnosed at age 72. Pt has 3 sisters.  maternal aunt was diagnosed at age 45 with breast cancer.   GYNECOLOGIC HISTORY:  Patient's last menstrual period was 05/03/1986. Menarche: 56 years old Age at first live birth: 56 years old GP: GXP2 LMP: At age 28 due to a partial hysterectomy (no salpingo-oophorectomy). 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 She lives at home with her Husband, Michelle Fernandez, who is a Administrator. She has an outside dog. aughter Michelle Fernandez lives in [place] and works as a Theme park manager. Daughter Michelle Fernandez lives in [place] and is a "stay at  home mom". The patient has 6 grandchildren, all boys.   ADVANCED DIRECTIVES: Yes, her husband is her healthcare POA.    HEALTH MAINTENANCE: Social History  Substance Use Topics  . Smoking status: Former Smoker    Types: Cigarettes    Quit date: 05/05/2006  . Smokeless tobacco: Never Used  . Alcohol use No     Colonoscopy: No  PAP: Last on 03/18/2015 and negative.  Bone density: No   No Known Allergies  Current Outpatient Prescriptions  Medication Sig Dispense Refill  . estradiol (VIVELLE-DOT) 0.05 MG/24HR patch Place 1 patch (0.05 mg total) onto the skin 2 (two) times a week. 24 patch 4  . hydrochlorothiazide (HYDRODIURIL) 25 MG tablet Take 25 mg by mouth daily.    . metoprolol succinate (TOPROL-XL) 25 MG 24 hr tablet Take 25 mg by mouth daily.    . rosuvastatin (CRESTOR) 10 MG tablet Take 10 mg by mouth daily. Dose uncertain    . SYNTHROID 125 MCG tablet Take 125 mcg by mouth daily.  6  . ZETIA 10 MG tablet      No current facility-administered medications for this visit.     OBJECTIVE: Middle-aged white woman in no acute distress  Vitals:   02/09/17 0903  BP: (!) 158/87  Pulse: 67  Resp: 18  Temp: 98.2 F (36.8 C)  SpO2: 100%     Body mass index is 24.62 kg/m.   Wt Readings from Last 3 Encounters:  02/09/17 139 lb (63 kg)  05/05/16 162 lb (73.5 kg)  03/24/16 162 lb (73.5 kg)      ECOG FS:0 - Asymptomatic  Ocular: Sclerae unicteric, pupils round and equal Ear-nose-throat: Oropharynx clear and moist Lymphatic: No cervical or supraclavicular adenopathy Lungs no rales or rhonchi Heart regular rate and rhythm Abd soft, nontender, positive bowel sounds MSK no focal spinal tenderness, no joint edema Neuro: non-focal, well-oriented, appropriate affect Breasts: The right breast is status post recent biopsy. There is a moderate ecchymosis. The left breast is benign. Both axillae are benign.   LAB RESULTS:  CMP     Component Value Date/Time   NA 142 02/09/2017  0846   K 3.3 (L) 02/09/2017 0846   CO2 27 02/09/2017 0846   GLUCOSE 87 02/09/2017 0846   BUN 6.8 (L) 02/09/2017 0846   CREATININE 0.7 02/09/2017 0846   CALCIUM 10.2 02/09/2017 0846   PROT 7.6 02/09/2017 0846   ALBUMIN 4.3 02/09/2017 0846   AST 21 02/09/2017 0846   ALT 25 02/09/2017 0846   ALKPHOS 58 02/09/2017 0846   BILITOT 0.53 02/09/2017 0846    No results found for: TOTALPROTELP, ALBUMINELP, A1GS, A2GS, BETS, BETA2SER, GAMS, MSPIKE, SPEI  No results found for: KPAFRELGTCHN, LAMBDASER, KAPLAMBRATIO  Lab Results  Component Value Date   WBC 5.8 02/09/2017   NEUTROABS 3.5 02/09/2017   HGB 15.1 02/09/2017   HCT 44.2 02/09/2017   MCV 89.8 02/09/2017   PLT 131 Large platelets present (L) 02/09/2017    @LASTCHEMISTRY @    No results found for: LABCA2  No components found for: ZJIRCV893  No results for input(s): INR in the last 168 hours.  No results found for: LABCA2  No results found for: YBO175  No results found for: ZWC585  No results found for:  AST419  No results found for: CA2729  No components found for: HGQUANT  No results found for: CEA1 / No results found for: CEA1   No results found for: AFPTUMOR  No results found for: CHROMOGRNA  No results found for: PSA1  Appointment on 02/09/2017  Component Date Value Ref Range Status  . WBC 02/09/2017 5.8  3.9 - 10.3 10e3/uL Final  . NEUT# 02/09/2017 3.5  1.5 - 6.5 10e3/uL Final  . HGB 02/09/2017 15.1  11.6 - 15.9 g/dL Final  . HCT 02/09/2017 44.2  34.8 - 46.6 % Final  . Platelets 02/09/2017 131 Large platelets present* 145 - 400 10e3/uL Final  . MCV 02/09/2017 89.8  79.5 - 101.0 fL Final  . MCH 02/09/2017 30.7  25.1 - 34.0 pg Final  . MCHC 02/09/2017 34.2  31.5 - 36.0 g/dL Final  . RBC 02/09/2017 4.92  3.70 - 5.45 10e6/uL Final  . RDW 02/09/2017 12.3  11.2 - 14.5 % Final  . lymph# 02/09/2017 1.6  0.9 - 3.3 10e3/uL Final  . MONO# 02/09/2017 0.5  0.1 - 0.9 10e3/uL Final  . Eosinophils Absolute  02/09/2017 0.1  0.0 - 0.5 10e3/uL Final  . Basophils Absolute 02/09/2017 0.0  0.0 - 0.1 10e3/uL Final  . NEUT% 02/09/2017 60.9  38.4 - 76.8 % Final  . LYMPH% 02/09/2017 28.3  14.0 - 49.7 % Final  . MONO% 02/09/2017 7.9  0.0 - 14.0 % Final  . EOS% 02/09/2017 2.4  0.0 - 7.0 % Final  . BASO% 02/09/2017 0.5  0.0 - 2.0 % Final  . Sodium 02/09/2017 142  136 - 145 mEq/L Final  . Potassium 02/09/2017 3.3* 3.5 - 5.1 mEq/L Final  . Chloride 02/09/2017 104  98 - 109 mEq/L Final  . CO2 02/09/2017 27  22 - 29 mEq/L Final  . Glucose 02/09/2017 87  70 - 140 mg/dl Final   Glucose reference range is for nonfasting patients. Fasting glucose reference range is 70- 100.  Marland Kitchen BUN 02/09/2017 6.8* 7.0 - 26.0 mg/dL Final  . Creatinine 02/09/2017 0.7  0.6 - 1.1 mg/dL Final  . Total Bilirubin 02/09/2017 0.53  0.20 - 1.20 mg/dL Final  . Alkaline Phosphatase 02/09/2017 58  40 - 150 U/L Final  . AST 02/09/2017 21  5 - 34 U/L Final  . ALT 02/09/2017 25  0 - 55 U/L Final  . Total Protein 02/09/2017 7.6  6.4 - 8.3 g/dL Final  . Albumin 02/09/2017 4.3  3.5 - 5.0 g/dL Final  . Calcium 02/09/2017 10.2  8.4 - 10.4 mg/dL Final  . Anion Gap 02/09/2017 10  3 - 11 mEq/L Final  . EGFR 02/09/2017 >60  >60 ml/min/1.73 m2 Final   eGFR is calculated using the CKD-EPI Creatinine Equation (2009)    (this displays the last labs from the last 3 days)  No results found for: TOTALPROTELP, ALBUMINELP, A1GS, A2GS, BETS, BETA2SER, GAMS, MSPIKE, SPEI (this displays SPEP labs)  No results found for: KPAFRELGTCHN, LAMBDASER, KAPLAMBRATIO (kappa/lambda light chains)  No results found for: HGBA, HGBA2QUANT, HGBFQUANT, HGBSQUAN (Hemoglobinopathy evaluation)   No results found for: LDH  No results found for: IRON, TIBC, IRONPCTSAT (Iron and TIBC)  No results found for: FERRITIN  Urinalysis    Component Value Date/Time   BILIRUBINUR N 03/24/2016 0917   PROTEINUR N 03/24/2016 0917   UROBILINOGEN negative 03/24/2016 0917   NITRITE  N 03/24/2016 0917   LEUKOCYTESUR Negative 03/24/2016 0917     STUDIES: Radiology studies discussed with the patient  ELIGIBLE FOR AVAILABLE RESEARCH PROTOCOL: COMET  ASSESSMENT: 56 y.o. [place] woman Status post right breast retroareolar biopsy 02/01/2017 for ductal carcinoma in situ grade 2, estrogen and progesterone receptor positive  (1) genetics testing pending  (2) breast conserving surgery being considered versus participation in COMET trial  (3) if patient undergoes surgery, adjuvant radiation to follow  (4) consider anti-estrogens at the end of local treatment or if patient participates in COMET  (5) intermittent tobacco abuse: The patient has been strongly encouraged to completely quit  PLAN: We spent the better part of today's hour-long appointment discussing the biology of her diagnosis and the specifics of her situation. Allyah understands that in noninvasive ductal carcinoma, also called ductal carcinoma in situ ("DCIS") the breast cancer cells remain trapped in the ducts were they started. They cannot travel to a vital organ. For that reason these cancers in themselves are not life-threatening.  If the whole breast is removed then all the ducts are removed and since the cancer cells are trapped in the ducts, the cure rate with mastectomy for noninvasive breast cancer is approximately 99%. Nevertheless we recommend lumpectomy, because there is no survival advantage to mastectomy and because the cosmetic result is generally superior with breast conservation.  Since the patient is keeping her breast, there will be some risk of recurrence. The recurrence can only be in the same breast since, again, the cells are trapped in the ducts. There is no connection from one breast to the other. The risk of local recurrence is cut by more than half with radiation, which is standard in this situation.  In estrogen receptor positive cancers like Margeret's, anti-estrogens can also be  considered. They will further reduce the risk of recurrence by one half. In addition anti-estrogens will lower the risk of a new breast cancer developing in either breast, also by one half. That risk approaches 1% per year. Anti-estrogens reduce it to 1/2% per year.  Accordingly the overall plan is for surgery, followed by radiation, then a discussion of anti-estrogens.  Beverlee Nims also qualifies for genetics testing. In patients who carry a deleterious mutation [for example in a  BRCA gene], the risk of a new breast cancer developing in the future may be sufficiently great that the patient may choose bilateral mastectomies. However if she wishes to keep her breasts in that situation it is safe to do so. That would require intensified screening, which generally means not only yearly mammography but a yearly breast MRI as well. Of course, if there is a deleterious mutation bilateral oophorectomy would be necessary as there is no standard screening protocol for ovarian cancer.  Finally Odette was somewhat equivocal regarding her smoking history. She understands that smoking can complicate her surgery and radiation treatments and decrease their effectiveness. She has been strongly urged to quit.  Danice has a good understanding of the overall plan. She agrees with it. She knows the goal of treatment in her case is cure. She will call with any problems that may develop before her next visit here.   Chauncey Cruel, MD 02/09/17 9:51 AM Medical Oncology and Hematology [facility] [address] [place], [place] [address] Tel. [phone] Fax. [phone]  This document serves as a record of services personally performed by Lurline Del, MD. It was created on her behalf by Steva Colder, a trained medical scribe. The creation of this record is based on the scribe's personal observations and the provider's statements to them. This document has been checked and  approved by the attending  provider.

## 2017-02-09 ENCOUNTER — Other Ambulatory Visit (HOSPITAL_BASED_OUTPATIENT_CLINIC_OR_DEPARTMENT_OTHER): Payer: BLUE CROSS/BLUE SHIELD

## 2017-02-09 ENCOUNTER — Ambulatory Visit: Payer: BLUE CROSS/BLUE SHIELD | Admitting: Physical Therapy

## 2017-02-09 ENCOUNTER — Encounter: Payer: Self-pay | Admitting: *Deleted

## 2017-02-09 ENCOUNTER — Ambulatory Visit (HOSPITAL_BASED_OUTPATIENT_CLINIC_OR_DEPARTMENT_OTHER): Payer: BLUE CROSS/BLUE SHIELD | Admitting: Oncology

## 2017-02-09 ENCOUNTER — Ambulatory Visit
Admission: RE | Admit: 2017-02-09 | Discharge: 2017-02-09 | Disposition: A | Discharge status: Home / Self Care | Payer: BLUE CROSS/BLUE SHIELD | Source: Ambulatory Visit | Attending: Radiation Oncology | Admitting: Radiation Oncology

## 2017-02-09 ENCOUNTER — Encounter: Payer: Self-pay | Admitting: Oncology

## 2017-02-09 VITALS — BP 158/87 | HR 67 | Temp 98.2°F | Resp 18 | Ht 63.0 in | Wt 139.0 lb

## 2017-02-09 DIAGNOSIS — Z510 Encounter for antineoplastic radiation therapy: Secondary | ICD-10-CM | POA: Insufficient documentation

## 2017-02-09 DIAGNOSIS — Z9889 Other specified postprocedural states: Secondary | ICD-10-CM | POA: Insufficient documentation

## 2017-02-09 DIAGNOSIS — Z803 Family history of malignant neoplasm of breast: Secondary | ICD-10-CM | POA: Insufficient documentation

## 2017-02-09 DIAGNOSIS — K219 Gastro-esophageal reflux disease without esophagitis: Secondary | ICD-10-CM | POA: Insufficient documentation

## 2017-02-09 DIAGNOSIS — N809 Endometriosis, unspecified: Secondary | ICD-10-CM | POA: Insufficient documentation

## 2017-02-09 DIAGNOSIS — Z170 Estrogen receptor positive status [ER+]: Secondary | ICD-10-CM | POA: Diagnosis not present

## 2017-02-09 DIAGNOSIS — Z833 Family history of diabetes mellitus: Secondary | ICD-10-CM | POA: Insufficient documentation

## 2017-02-09 DIAGNOSIS — Z801 Family history of malignant neoplasm of trachea, bronchus and lung: Secondary | ICD-10-CM | POA: Insufficient documentation

## 2017-02-09 DIAGNOSIS — D0511 Intraductal carcinoma in situ of right breast: Secondary | ICD-10-CM

## 2017-02-09 DIAGNOSIS — Z791 Long term (current) use of non-steroidal anti-inflammatories (NSAID): Secondary | ICD-10-CM | POA: Insufficient documentation

## 2017-02-09 DIAGNOSIS — Z79899 Other long term (current) drug therapy: Secondary | ICD-10-CM | POA: Insufficient documentation

## 2017-02-09 DIAGNOSIS — J45909 Unspecified asthma, uncomplicated: Secondary | ICD-10-CM | POA: Insufficient documentation

## 2017-02-09 DIAGNOSIS — Z87891 Personal history of nicotine dependence: Secondary | ICD-10-CM | POA: Insufficient documentation

## 2017-02-09 DIAGNOSIS — Z8249 Family history of ischemic heart disease and other diseases of the circulatory system: Secondary | ICD-10-CM | POA: Insufficient documentation

## 2017-02-09 DIAGNOSIS — Z888 Allergy status to other drugs, medicaments and biological substances status: Secondary | ICD-10-CM | POA: Insufficient documentation

## 2017-02-09 DIAGNOSIS — E0500 Thyrotoxicosis with diffuse goiter without thyrotoxic crisis or storm: Secondary | ICD-10-CM | POA: Insufficient documentation

## 2017-02-09 LAB — CBC WITH DIFFERENTIAL/PLATELET
BASO%: 0.5 % (ref 0.0–2.0)
Basophils Absolute: 0 10*3/uL (ref 0.0–0.1)
EOS ABS: 0.1 10*3/uL (ref 0.0–0.5)
EOS%: 2.4 % (ref 0.0–7.0)
HEMATOCRIT: 44.2 % (ref 34.8–46.6)
HGB: 15.1 g/dL (ref 11.6–15.9)
LYMPH#: 1.6 10*3/uL (ref 0.9–3.3)
LYMPH%: 28.3 % (ref 14.0–49.7)
MCH: 30.7 pg (ref 25.1–34.0)
MCHC: 34.2 g/dL (ref 31.5–36.0)
MCV: 89.8 fL (ref 79.5–101.0)
MONO#: 0.5 10*3/uL (ref 0.1–0.9)
MONO%: 7.9 % (ref 0.0–14.0)
NEUT%: 60.9 % (ref 38.4–76.8)
NEUTROS ABS: 3.5 10*3/uL (ref 1.5–6.5)
RBC: 4.92 10*6/uL (ref 3.70–5.45)
RDW: 12.3 % (ref 11.2–14.5)
WBC: 5.8 10*3/uL (ref 3.9–10.3)

## 2017-02-09 LAB — COMPREHENSIVE METABOLIC PANEL
ALT: 25 U/L (ref 0–55)
ANION GAP: 10 meq/L (ref 3–11)
AST: 21 U/L (ref 5–34)
Albumin: 4.3 g/dL (ref 3.5–5.0)
Alkaline Phosphatase: 58 U/L (ref 40–150)
BILIRUBIN TOTAL: 0.53 mg/dL (ref 0.20–1.20)
BUN: 6.8 mg/dL — ABNORMAL LOW (ref 7.0–26.0)
CALCIUM: 10.2 mg/dL (ref 8.4–10.4)
CO2: 27 mEq/L (ref 22–29)
CREATININE: 0.7 mg/dL (ref 0.6–1.1)
Chloride: 104 mEq/L (ref 98–109)
Glucose: 87 mg/dl (ref 70–140)
Potassium: 3.3 mEq/L — ABNORMAL LOW (ref 3.5–5.1)
Sodium: 142 mEq/L (ref 136–145)
TOTAL PROTEIN: 7.6 g/dL (ref 6.4–8.3)

## 2017-02-09 NOTE — Progress Notes (Signed)
Clinical Social Work [place] Psychosocial Distress Screening [place]  Patient completed distress screening protocol and scored a 5 on the Psychosocial Distress Thermometer which indicates moderate distress. Clinical Social Worker met with patient and patients family in [facility] to assess for distress and other psychosocial needs. Patient stated she was feeling overwhelmed but felt "better" after meeting with the treatment team and getting more information on her treatment plan. CSW and patient discussed common feeling and emotions when being diagnosed with cancer, and the importance of support during treatment. CSW informed patient of the support team and support services at [facility]. CSW provided contact information and encouraged patient to call with any questions or concerns.  ONCBCN DISTRESS SCREENING 02/09/2017  Screening Type Initial Screening  Distress experienced in past week (1-10) 5  Family Problem type Other (comment)     Johnnye Lana, MSW, LCSW, OSW-C Clinical Social Worker [place] [phone]

## 2017-02-09 NOTE — Progress Notes (Signed)
Nutrition Assessment  Reason for Assessment:  Pt seen in Breast Clinic  ASSESSMENT:   56 year old female with new diagnosis of breast cancer.  Past medical history of HTN, high cholesterol.   Patient reports that she is has been attending weight watchers recently.    Medications:  reviewed  Labs: reveiewed  Anthropometrics:   Height: 63 inches Weight: 162 lb BMI: 28.8   NUTRITION DIAGNOSIS: Food and nutrition related knowledge deficit related to new diagnosis of breast cancer as evidenced by no prior need for nutrition related information.  INTERVENTION:   Discussed and provided packet of information regarding nutritional tips for breast cancer patients.  Questions answered.  Teachback method used.  Contact information provided and patient knows to contact me with questions/concerns.    MONITORING, EVALUATION, and GOAL: Pt will consume a healthy plant based diet to maintain lean body mass throughout treatment.   Tagg Eustice B. Zenia Resides, [place], [place] Registered Dietitian [phone] (pager)

## 2017-02-09 NOTE — Progress Notes (Signed)
Radiation Oncology         [phone] ________________________________  Initial Outpatient Consultation  Name: Michelle Fernandez MRN: [id number]  Date: 02/09/2017  DOB: 18-Oct-1960  RC:VELFY, Annie Main, MD  Rolm Bookbinder, MD   REFERRING PHYSICIAN: Rolm Bookbinder, MD  DIAGNOSIS: The encounter diagnosis was Ductal carcinoma in situ (DCIS) of right breast. DCIS, intermediate grade   HISTORY OF PRESENT ILLNESS::Michelle Fernandez is a 56 y.o. female who is here for her newly diagnosed right breast cancer. Initially, the pt underwent diagnostic mammogram of the right breast on 08/10/16. At that time, her the previously shown calcifications within the breast were shown to be benign and f/u mammography was recommended in 59mo. This was performed on 02/07/17 which demonstrated that the previously shown calcifications were now indeterminate and suspicious for malignancy. A biopsy was performed on the same day which demonstrated DCIS  with calcifications. Histological analysis was positive for both estrogen (strongly 50%) and progesterone (strongly 40%) receptors.   Of note, the patient's mother was diagnosed with breast cancer at the age of 72yo. Her aunt has also previously been diagnosed with breast cancer.   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    PAST SURGICAL HISTORY: Past Surgical History:  Procedure Laterality Date  . ANTERIOR CRUCIATE LIGAMENT REPAIR Left 1991  . CHOLECYSTECTOMY, LAPAROSCOPIC  2001  . [place]  2001   from lap chole  . MENISCUS REPAIR Left 1993  . ULNAR TUNNEL RELEASE Right   . VAGINAL HYSTERECTOMY  1988   for endometriosos    FAMILY HISTORY: family history includes Breast cancer (age of onset: 15) in her maternal aunt; Breast cancer (age of onset: 46) in  her mother; Dementia in her mother; Diabetes Mellitus I in her mother; Heart attack in her father; Lung cancer in her mother.  SOCIAL HISTORY:  reports that she quit smoking about 10 years ago. Her smoking use included Cigarettes. She has never used smokeless tobacco. She reports that she does not drink alcohol or use drugs.  ALLERGIES: Patient has no known allergies.  MEDICATIONS:  Current Outpatient Prescriptions  Medication Sig Dispense Refill  . estradiol (VIVELLE-DOT) 0.05 MG/24HR patch Place 1 patch (0.05 mg total) onto the skin 2 (two) times a week. 24 patch 4  . hydrochlorothiazide (HYDRODIURIL) 25 MG tablet Take 25 mg by mouth daily.    . metoprolol succinate (TOPROL-XL) 25 MG 24 hr tablet Take 25 mg by mouth daily.    . rosuvastatin (CRESTOR) 10 MG tablet Take 10 mg by mouth daily. Dose uncertain    . SYNTHROID 125 MCG tablet Take 125 mcg by mouth daily.  6  . ZETIA 10 MG tablet      No current facility-administered medications for this encounter.     REVIEW OF SYSTEMS:  REVIEW OF SYSTEMS: A 10+ POINT REVIEW OF SYSTEMS WAS OBTAINED including neurology, dermatology, psychiatry, cardiac, respiratory, lymph, extremities, GI, GU, musculoskeletal, constitutional, reproductive, HEENT. All pertinent positives are noted in the HPI. All others are negative. Pertinent items are noted in HPI. Pt denies any pain within the breast, nipple discharge or bleeding prior to diagnosis.    PHYSICAL EXAM:  Vitals - 1 value per visit 02/16/5101  SYSTOLIC 585  DIASTOLIC 87  Pulse 67  Temperature 98.2  Respirations 18  Weight (lb) 139  Height 5\' 3"   BMI 24.62  Lungs are clear to auscultation bilaterally. Heart has regular rate and rhythm. No palpable cervical, supraclavicular, or axillary adenopathy. Abdomen soft, non-tender, normal bowel sounds. Left breast without palpable mass or nipple discharge. Right breast with bruising in upper central aspect of the breast. Thickening measuring 2-3cm. No  nipple discharge or bleeding. Good ROM in the arm and shoulders.    ECOG = 0  LABORATORY DATA:  Lab Results  Component Value Date   WBC 5.8 02/09/2017   HGB 15.1 02/09/2017   HCT 44.2 02/09/2017   MCV 89.8 02/09/2017   PLT 131 Large platelets present (L) 02/09/2017   NEUTROABS 3.5 02/09/2017   Lab Results  Component Value Date   NA 142 02/09/2017   K 3.3 (L) 02/09/2017   CO2 27 02/09/2017   GLUCOSE 87 02/09/2017   CREATININE 0.7 02/09/2017   CALCIUM 10.2 02/09/2017    RADIOGRAPHY: No results found.    IMPRESSION: Michelle Fernandez is a  56 y.o. female who presents to discuss management of her intermediate grade DCIS. She will undergo lumpectomy without lymph node biopsy in the near future, per surgery. She is a candidate for adjuvant radiation therapy. She has intermediate grade invasive ductal breast cancer. We discussed local management issues including lumpectomy and radiation. We also discussed mastectomy as a potential option. The pt does have significant genetic history with her mother and aunt having breast cancer, we recommend genetic evaluation which the patient agrees to. We discussed on the issues with the COMET trial and we will evaluate genetic evaluation before considering this approach with her management.   PLAN: Genetics evaluation, if this is negative then pt will proceed with right lumpectomy, adjuvant radiation therapy and an aromatase inhibitor. Possibly enroll in the COMET trial.     ------------------------------------------------  Blair Promise, PhD, MD  This document serves as a record of services personally performed by Gery Pray, MD. It was created on his behalf by Reola Mosher, a trained medical scribe. The creation of this record is based on the scribe's personal observations and the provider's statements to them. This document has been checked and approved by the attending provider.

## 2017-02-10 ENCOUNTER — Ambulatory Visit (HOSPITAL_BASED_OUTPATIENT_CLINIC_OR_DEPARTMENT_OTHER): Payer: BLUE CROSS/BLUE SHIELD | Admitting: Genetic Counselor

## 2017-02-10 ENCOUNTER — Encounter: Payer: Self-pay | Admitting: Genetic Counselor

## 2017-02-10 DIAGNOSIS — C50919 Malignant neoplasm of unspecified site of unspecified female breast: Secondary | ICD-10-CM | POA: Insufficient documentation

## 2017-02-10 DIAGNOSIS — D0510 Intraductal carcinoma in situ of unspecified breast: Secondary | ICD-10-CM

## 2017-02-10 DIAGNOSIS — Z801 Family history of malignant neoplasm of trachea, bronchus and lung: Secondary | ICD-10-CM

## 2017-02-10 DIAGNOSIS — Z1379 Encounter for other screening for genetic and chromosomal anomalies: Secondary | ICD-10-CM

## 2017-02-10 DIAGNOSIS — Z7183 Encounter for nonprocreative genetic counseling: Secondary | ICD-10-CM | POA: Diagnosis not present

## 2017-02-10 DIAGNOSIS — Z803 Family history of malignant neoplasm of breast: Secondary | ICD-10-CM | POA: Diagnosis not present

## 2017-02-10 HISTORY — DX: Encounter for other screening for genetic and chromosomal anomalies: Z13.79

## 2017-02-10 NOTE — Progress Notes (Signed)
[facility]      Initial Visit   Patient Name: Michelle Fernandez Patient DOB: [birth year] Patient Age: 56 y.o. Encounter Date: 02/10/2017  Referring Provider: Lurline Del, MD  Primary Care Provider: Reynold Bowen, MD  Reason for Visit: Evaluate for hereditary susceptibility to cancer    Assessment and Plan:  . Michelle Fernandez's history is not highly suggestive of a hereditary predisposition to cancer, but she and 2 other relatives have breast cancer. She meets NCCN criteria for genetic testing.  . Testing is recommended to determine whether she has a pathogenic mutation that will impact her treatment decisions as well as her future screening and risk-reduction for cancer. A negative result will be reassuring.  . Michelle Fernandez wished to pursue genetic testing and a blood sample will be sent to [facility] for analysis. [facility]'s STAT breast panel was requested as it will impact surgical decisions. Results should be available in about 7-12 days. The 9 genes on this panel are ATM, BRCA1, BRCA2, CDH1, CHEK2, PALB2, PTEN, STK11, TP53. Once this test is complete, analysis of additional genes on a larger hereditary cancer panel will proceed. She will be called after each result is obtained.   Dr. Jana Hakim was available for questions concerning this case. Total time spent by me in face-to-face counseling was approximately 30 minutes.   _____________________________________________________________________   History of Present Illness: Michelle Fernandez, a 56 y.o. female, is being seen at the [facility] due to a personal and family history of breast cancer. She presents to clinic today to discuss the possibility of a hereditary predisposition to cancer and discuss whether genetic testing is warranted.  Michelle Fernandez was recently diagnosed with breast cancer (DCIS) at the age of 72. She indicated that results of genetic testing will help guide her  treatment decisions. She is considering enrolling in the COMET trial.  Michelle Fernandez reported having a hysterectomy at age 64, but her ovaries remain intact. She has not yet initiated colon cancer screening.  Past Medical History:  Diagnosis Date  . Breast cancer ([place])   . Endometriosis   . Family history of breast cancer   . GERD (gastroesophageal reflux disease)   . Graves disease   . 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 Hypertension   . Migraine   . Personal history of asthma     Past Surgical History:  Procedure Laterality Date  . ANTERIOR CRUCIATE LIGAMENT REPAIR Left 1991  . CHOLECYSTECTOMY, LAPAROSCOPIC  2001  . [place]  2001   from lap chole  . MENISCUS REPAIR Left 1993  . ULNAR TUNNEL RELEASE Right   . VAGINAL HYSTERECTOMY  1988   for endometriosos    Social History   Social History  . Marital status: Married    Spouse name: N/A  . Number of children: N/A  . Years of education: N/A   Occupational History  . Scientist, clinical (histocompatibility and immunogenetics)    Social History Main Topics  . Smoking status: Former Smoker    Types: Cigarettes    Quit date: 05/05/2006  . Smokeless tobacco: Never Used  . Alcohol use No  . Drug use: No  . Sexual activity: Yes    Partners: Male    Birth control/ protection: Surgical     Comment: TV[facility]   Other Topics Concern  . Not on file   Social History Narrative  . No narrative on file     Family History:  During  the visit, a 4-generation pedigree was obtained. Family tree will be scanned in the Media tab in Epic  Significant diagnoses include the following:  Family History  Problem Relation Age of Onset  . Breast cancer Mother 21       TAH/BSO 31s; currently 67  . Diabetes Mellitus I Mother   . Lung cancer Mother 26  . Dementia Mother   . Heart attack Father   . Breast cancer Maternal Aunt 52       currently 60s  . Multiple myeloma Sister 15  . Cancer Maternal Uncle        unk. type; deceased 83s  . Cancer Cousin        son of mat  uncle with cancer; unk. type; deceased 50s    Additionally, Michelle Fernandez has two daughters (ages 75 and 38). She has a total of 3 sisters. Her mother has 3 sisters and 2 brothers. Her father died at 63 of an MI. He had siblings, but Michelle Fernandez had no information about their health histories.  Michelle Fernandez ancestry is Caucasian - NOS. There is no known Jewish ancestry and no consanguinity.  Discussion: We reviewed the characteristics, features and inheritance patterns of hereditary cancer syndromes. We discussed her risk of harboring a mutation in the context of her personal and family history. We discussed that her unknown paternal family makes risk assessment challenging. We discussed the process of genetic testing, insurance coverage and implications of results: positive, negative and variant of unknown significance (VUS).    Michelle Fernandez questions were answered to her satisfaction today and she is welcome to call with any additional questions or concerns. Thank you for the referral and allowing [place] to share in the care of your patient.    Steele Berg, MS, [place] Certified Genetic Counselor phone: [phone] [email or web address]   ______________________________________________________________________ For Office Staff:  Number of people involved in session: 1 Was an Intern/ student involved with case: no

## 2017-02-15 ENCOUNTER — Other Ambulatory Visit: Payer: Self-pay | Admitting: *Deleted

## 2017-02-15 ENCOUNTER — Telehealth: Payer: Self-pay | Admitting: *Deleted

## 2017-02-15 DIAGNOSIS — D0511 Intraductal carcinoma in situ of right breast: Secondary | ICD-10-CM

## 2017-02-15 MED ORDER — VENLAFAXINE HCL ER 75 MG PO CP24: 75.0000 mg | ORAL_CAPSULE | Freq: Every day | ORAL | 3 refills | Status: DC

## 2017-02-15 NOTE — Telephone Encounter (Signed)
  Oncology Nurse Navigator Documentation  Navigator Location: [facility] (02/15/17 1600)   )Navigator Encounter Type: Telephone (02/15/17 1600) Telephone: Outgoing Call;Clinic/MDC Follow-up (02/15/17 1600)                                                  Time Spent with Patient: 15 (02/15/17 1600)

## 2017-02-15 NOTE — Telephone Encounter (Signed)
Spoke with patient and since being off the estrogen patch she is having horrible hot flashes.  Per Dr. Jana Hakim patient can start Effexor XR 75mg .

## 2017-02-16 ENCOUNTER — Ambulatory Visit: Payer: Self-pay | Admitting: Genetic Counselor

## 2017-02-16 NOTE — Progress Notes (Signed)
[facility]   Patient Name: Michelle Fernandez Patient DOB: [birth year] Encounter Date: 02/16/2017  Referring Provider: Lurline Del, MD  Reason for Call: Discuss results of genetic testing- 1st of 2 results   This is a brief note to document preliminary genetic test results.  Michelle Fernandez was called today to discuss the first of her genetic test results. Please see the Genetics note from her visit on 02/10/2017. Due to time contraints and needing actionable results for medical management, [facility]'s STAT Breast panel was ordered first.  Preliminary Test Results: Genetic testing involved analysis of 9 genes: ATM, BRCA1, BRCA2, CDH1, CHEK2, PALB2, PTEN, STK11 and TP53 genes. Testing was normal and did not reveal a mutation in these genes. Testing is in process for the remaining genes on the Common Cancers panel.  Once results are obtained, Michelle Fernandez will be called again. She does not need to wait to proceed.   Steele Berg, MS, [place] Certified Genetic Counselor phone: [phone]

## 2017-02-17 ENCOUNTER — Telehealth: Payer: Self-pay | Admitting: Oncology

## 2017-02-17 NOTE — Telephone Encounter (Signed)
Spoke with patient regarding appt scheduled per 10/10 los.

## 2017-02-18 ENCOUNTER — Telehealth: Payer: Self-pay | Admitting: *Deleted

## 2017-02-18 NOTE — Telephone Encounter (Signed)
Spoke with patient and she does not want to participate in COMET trial. I have sent Dr. Donne Hazel message to let him know that she would like to proceed with surgery.

## 2017-02-21 ENCOUNTER — Other Ambulatory Visit: Payer: Self-pay | Admitting: General Surgery

## 2017-02-21 DIAGNOSIS — D0511 Intraductal carcinoma in situ of right breast: Secondary | ICD-10-CM

## 2017-02-22 ENCOUNTER — Encounter: Payer: Self-pay | Admitting: Obstetrics & Gynecology

## 2017-02-22 ENCOUNTER — Encounter: Payer: Self-pay | Admitting: Genetic Counselor

## 2017-02-22 ENCOUNTER — Ambulatory Visit: Payer: Self-pay | Admitting: Genetic Counselor

## 2017-02-22 DIAGNOSIS — Z1379 Encounter for other screening for genetic and chromosomal anomalies: Secondary | ICD-10-CM

## 2017-02-22 NOTE — Progress Notes (Signed)
Cancer Genetics Clinic       Genetic Test Results    Patient Name: GLENDI MOHIUDDIN Patient DOB: February 07, 1961 Patient Age: 56 y.o. Encounter Date: 02/22/2017  Referring Provider: Lurline Del, MD  Primary Care Provider: Reynold Bowen, MD   Ms. Hendry was called today to discuss additional genetic test results. Please see the Genetics note from her visit on 02/10/2017 for a detailed discussion of her personal and family history.  Genetic Testing: At the time of Ms. Kolton' visit, she decided to pursue genetic testing of 9 genes that may be used to help guide treatment decisions, specifically enrollment in the COMET trial. Once that test was completed, additional genes on a larger panel were analyzed. Testing which included sequencing and deletion/duplication analysis. Testing did not reveal any pathogenic mutation in any of these genes. A copy of the genetic test report will be scanned into Epic under the media tab.  The genes analyzed were the 47 genes on [facility]'s Common Cancers panel (APC, ATM, AXIN2, BARD1, BMPR1A, BRCA1, BRCA2, BRIP1, CDH1, CDK4, CDKN2A, CHEK2, CTNNA1, DICER1, EPCAM, GREM1, HOXB13, KIT, MEN1, MLH1, MSH2, MSH3, MSH6, MUTYH, NBN, NF1, NTHL1, PALB2, PDGFRA, PMS2, POLD1, POLE, PTEN, RAD50, RAD51C, RAD51D, SDHA, SDHB, SDHC, SDHD, SMAD4, SMARCA4, STK11, TP53, TSC1, TSC2, VHL).  Since the current test is not perfect, it is possible that there may be a gene mutation that current testing cannot detect, but that chance is small. It is possible that a different genetic factor, which has not yet been discovered or is not on this panel, is responsible for the cancer diagnoses in the family. Again, the likelihood of this is low. No additional testing is recommended at this time for Ms. Laakso.  Cancer Screening: These results suggest that Ms. Homewood' cancer was most likely not due to an inherited predisposition. Most cancers happen by chance and this test, along  with details of her family history, suggests that her cancer falls into this category. We discussed continuing to follow the cancer screening guidelines provided by her physician.   Family Members: Family members are at some increased risk of developing cancer, over the general population risk, simply due to the family history. Women are recommended to speak with their own providers about appropriate breast cancer screenings based on the family history.  Any relative who had cancer at a young age or had a particularly rare cancer may also wish to pursue genetic testing. Genetic counselors can be located in other cities, by visiting the website of the [employer] (ArtistMovie.se) and Field seismologist for a Dietitian by zip code.     Lastly, cancer genetics is a rapidly advancing field and it is possible that new genetic tests will be appropriate for Ms. Oscar in the future. We encourage her to remain in contact with [place] on an annual basis so we can update her personal and family histories, and let her know of advances in cancer genetics that may benefit the family. Our contact number was provided. Ms. Kos is welcome to call anytime with additional questions.     Steele Berg, MS, [place] Certified Genetic Counselor phone: [phone]

## 2017-03-04 ENCOUNTER — Other Ambulatory Visit: Payer: Self-pay | Admitting: *Deleted

## 2017-03-04 DIAGNOSIS — D0511 Intraductal carcinoma in situ of right breast: Secondary | ICD-10-CM

## 2017-03-08 ENCOUNTER — Encounter: Payer: Self-pay | Admitting: Radiation Oncology

## 2017-03-25 NOTE — Pre-Procedure Instructions (Signed)
Michelle Fernandez  03/25/2017      [id number] [place], [place] - [address] [place] [address] Phone: [phone] Fax: [phone]    Your procedure is scheduled on Dec. 3.  Report to [place] at 730 A.M.  Call this number if you have problems the morning of surgery:  [phone]   Remember:  Do not eat food or drink liquids after midnight.  Take these medicines the morning of surgery with A SIP OF WATER Metoprolol Succinate (Toprol-XL), synthroid, Venlafaxine (Effexor-XR) Stop taking aspirin, BC's, Goody's, Herbal medications, Fish Oil, Ibuprofen, Advil, Motrin, Aleve, Vitamins   Do not wear jewelry, make-up or nail polish.  Do not wear lotions, powders, or perfumes, or deoderant.  Do not shave 48 hours prior to surgery.  Men may shave face and neck.  Do not bring valuables to the hospital.  [facility] is not responsible for any belongings or valuables.  Contacts, dentures or bridgework may not be worn into surgery.  Leave your suitcase in the car.  After surgery it may be brought to your room.  For patients admitted to the hospital, discharge time will be determined by your treatment team.  Patients discharged the day of surgery will not be allowed to drive home.    Special instructions:  [facility] - Preparing for Surgery  Before surgery, you can play an important role.  Because skin is not sterile, your skin needs to be as free of germs as possible.  You can reduce the number of germs on you skin by washing with CHG (chlorahexidine gluconate) soap before surgery.  CHG is an antiseptic cleaner which kills germs and bonds with the skin to continue killing germs even after washing.  Please DO NOT use if you have an allergy to CHG or antibacterial soaps.  If your skin becomes reddened/irritated stop using the CHG and inform your nurse when you arrive at Short Stay.  Do not  shave (including legs and underarms) for at least 48 hours prior to the first CHG shower.  You may shave your face.  Please follow these instructions carefully:   1.  Shower with CHG Soap the night before surgery and the                                morning of Surgery.  2.  If you choose to wash your hair, wash your hair first as usual with your       normal shampoo.  3.  After you shampoo, rinse your hair and body thoroughly to remove the                      Shampoo.  4.  Use CHG as you would any other liquid soap.  You can apply chg directly       to the skin and wash gently with scrungie or a clean washcloth.  5.  Apply the CHG Soap to your body ONLY FROM THE NECK DOWN.        Do not use on open wounds or open sores.  Avoid contact with your eyes,       ears, mouth and genitals (private parts).  Wash genitals (private parts)       with your normal soap.  6.  Wash thoroughly, paying special attention  to the area where your surgery        will be performed.  7.  Thoroughly rinse your body with warm water from the neck down.  8.  DO NOT shower/wash with your normal soap after using and rinsing off       the CHG Soap.  9.  Pat yourself dry with a clean towel.            10.  Wear clean pajamas.            11.  Place clean sheets on your bed the night of your first shower and do not        sleep with pets.  Day of Surgery  Do not apply any lotions/deoderants the morning of surgery.  Please wear clean clothes to the hospital/surgery center.     Please read over the following fact sheets that you were given. Pain Booklet, Coughing and Deep Breathing and Surgical Site Infection Prevention

## 2017-03-28 ENCOUNTER — Encounter (HOSPITAL_COMMUNITY)
Admission: RE | Admit: 2017-03-28 | Discharge: 2017-03-28 | Disposition: A | Discharge status: Home / Self Care | Payer: BLUE CROSS/BLUE SHIELD | Source: Ambulatory Visit | Attending: General Surgery | Admitting: General Surgery

## 2017-03-28 ENCOUNTER — Other Ambulatory Visit: Payer: Self-pay

## 2017-03-28 ENCOUNTER — Encounter (HOSPITAL_COMMUNITY): Payer: Self-pay

## 2017-03-28 DIAGNOSIS — I10 Essential (primary) hypertension: Secondary | ICD-10-CM | POA: Insufficient documentation

## 2017-03-28 DIAGNOSIS — Z01818 Encounter for other preprocedural examination: Secondary | ICD-10-CM | POA: Insufficient documentation

## 2017-03-28 HISTORY — DX: Nausea with vomiting, unspecified: R11.2

## 2017-03-28 HISTORY — DX: Other specified postprocedural states: Z98.890

## 2017-03-28 LAB — CBC
HEMATOCRIT: 43.7 % (ref 36.0–46.0)
Hemoglobin: 14.8 g/dL (ref 12.0–15.0)
MCH: 30.3 pg (ref 26.0–34.0)
MCHC: 33.9 g/dL (ref 30.0–36.0)
MCV: 89.4 fL (ref 78.0–100.0)
PLATELETS: 121 10*3/uL — AB (ref 150–400)
RBC: 4.89 MIL/uL (ref 3.87–5.11)
RDW: 12.6 % (ref 11.5–15.5)
WBC: 5.4 10*3/uL (ref 4.0–10.5)

## 2017-03-28 LAB — BASIC METABOLIC PANEL
Anion gap: 9 (ref 5–15)
BUN: <5 mg/dL — ABNORMAL LOW (ref 6–20)
CHLORIDE: 103 mmol/L (ref 101–111)
CO2: 26 mmol/L (ref 22–32)
CREATININE: 0.61 mg/dL (ref 0.44–1.00)
Calcium: 9.5 mg/dL (ref 8.9–10.3)
Glucose, Bld: 87 mg/dL (ref 65–99)
POTASSIUM: 3.6 mmol/L (ref 3.5–5.1)
SODIUM: 138 mmol/L (ref 135–145)

## 2017-03-28 MED ORDER — ENSURE PRE-SURGERY PO LIQD
592.0000 mL | Freq: Once | ORAL | Status: DC
  Filled 2017-03-28: qty 592

## 2017-03-28 NOTE — Progress Notes (Signed)
PCP - Dr. Forde Dandy Cardiologist - denies cardiologist or cardiac workup  EKG - 03/28/2017   Patient denies shortness of breath, fever, cough and chest pain at PAT appointment  Patient verbalized understanding of instructions that were given to them at the PAT appointment. Patient was also instructed that they will need to review over the PAT instructions again at home before surgery.

## 2017-04-03 NOTE — Anesthesia Preprocedure Evaluation (Addendum)
Anesthesia Evaluation  Patient identified by MRN, date of birth, ID band Patient awake    Reviewed: Allergy & Precautions, NPO status , Patient's Chart, lab work & pertinent test results  History of Anesthesia Complications (+) PONV and history of anesthetic complications  Airway Mallampati: II  TM Distance: >3 FB Neck ROM: Full    Dental no notable dental hx.    Pulmonary neg pulmonary ROS, former smoker,    Pulmonary exam normal breath sounds clear to auscultation       Cardiovascular hypertension, Pt. on medications and Pt. on home beta blockers negative cardio ROS Normal cardiovascular exam Rhythm:Regular Rate:Normal     Neuro/Psych negative neurological ROS  negative psych ROS   GI/Hepatic negative GI ROS, Neg liver ROS, GERD  ,  Endo/Other  negative endocrine ROSHypothyroidism   Renal/GU negative Renal ROS  negative genitourinary   Musculoskeletal negative musculoskeletal ROS (+)   Abdominal   Peds negative pediatric ROS (+)  Hematology negative hematology ROS (+)   Anesthesia Other Findings   Reproductive/Obstetrics negative OB ROS                             Anesthesia Physical Anesthesia Plan  ASA: II  Anesthesia Plan: General   Post-op Pain Management:    Induction: Intravenous  PONV Risk Score and Plan: 4 or greater and Ondansetron, Dexamethasone, Treatment may vary due to age or medical condition, Propofol infusion, TIVA and Scopolamine patch - Pre-op  Airway Management Planned: LMA  Additional Equipment:   Intra-op Plan:   Post-operative Plan:   Informed Consent: I have reviewed the patients History and Physical, chart, labs and discussed the procedure including the risks, benefits and alternatives for the proposed anesthesia with the patient or authorized representative who has indicated his/her understanding and acceptance.     Plan Discussed with: CRNA  and Surgeon  Anesthesia Plan Comments: ( )        Anesthesia Quick Evaluation

## 2017-04-04 ENCOUNTER — Encounter (HOSPITAL_COMMUNITY): Payer: Self-pay

## 2017-04-04 ENCOUNTER — Ambulatory Visit (HOSPITAL_COMMUNITY): Payer: BLUE CROSS/BLUE SHIELD | Admitting: Anesthesiology

## 2017-04-04 ENCOUNTER — Ambulatory Visit (HOSPITAL_COMMUNITY)
Admission: RE | Admit: 2017-04-04 | Discharge: 2017-04-04 | Disposition: A | Discharge status: Home / Self Care | Payer: BLUE CROSS/BLUE SHIELD | Source: Ambulatory Visit | Attending: General Surgery | Admitting: General Surgery

## 2017-04-04 ENCOUNTER — Encounter (HOSPITAL_COMMUNITY)
Admission: RE | Disposition: A | Discharge status: Home / Self Care | Payer: Self-pay | Source: Ambulatory Visit | Attending: General Surgery

## 2017-04-04 DIAGNOSIS — K219 Gastro-esophageal reflux disease without esophagitis: Secondary | ICD-10-CM | POA: Diagnosis not present

## 2017-04-04 DIAGNOSIS — I10 Essential (primary) hypertension: Secondary | ICD-10-CM | POA: Diagnosis not present

## 2017-04-04 DIAGNOSIS — N6091 Unspecified benign mammary dysplasia of right breast: Secondary | ICD-10-CM | POA: Diagnosis not present

## 2017-04-04 DIAGNOSIS — Z79899 Other long term (current) drug therapy: Secondary | ICD-10-CM | POA: Insufficient documentation

## 2017-04-04 DIAGNOSIS — E039 Hypothyroidism, unspecified: Secondary | ICD-10-CM | POA: Diagnosis not present

## 2017-04-04 DIAGNOSIS — D0511 Intraductal carcinoma in situ of right breast: Secondary | ICD-10-CM | POA: Diagnosis not present

## 2017-04-04 DIAGNOSIS — E7800 Pure hypercholesterolemia, unspecified: Secondary | ICD-10-CM | POA: Insufficient documentation

## 2017-04-04 DIAGNOSIS — Z888 Allergy status to other drugs, medicaments and biological substances status: Secondary | ICD-10-CM | POA: Insufficient documentation

## 2017-04-04 DIAGNOSIS — Z87891 Personal history of nicotine dependence: Secondary | ICD-10-CM | POA: Insufficient documentation

## 2017-04-04 HISTORY — PX: BREAST LUMPECTOMY WITH RADIOACTIVE SEED LOCALIZATION: SHX6424

## 2017-04-04 SURGERY — BREAST LUMPECTOMY WITH RADIOACTIVE SEED LOCALIZATION
Anesthesia: General | Site: Breast | Laterality: Right

## 2017-04-04 MED ORDER — MIDAZOLAM HCL 2 MG/2ML IJ SOLN
INTRAMUSCULAR | Status: AC
  Filled 2017-04-04: qty 2

## 2017-04-04 MED ORDER — FENTANYL CITRATE (PF) 100 MCG/2ML IJ SOLN
INTRAMUSCULAR | Status: AC
  Filled 2017-04-04: qty 2

## 2017-04-04 MED ORDER — DEXAMETHASONE SODIUM PHOSPHATE 10 MG/ML IJ SOLN
INTRAMUSCULAR | Status: DC | PRN
  Administered 2017-04-04: 10 mg via INTRAVENOUS

## 2017-04-04 MED ORDER — CEFAZOLIN SODIUM-DEXTROSE 2-4 GM/100ML-% IV SOLN
2.0000 g | INTRAVENOUS | Status: AC
  Administered 2017-04-04: 2 g via INTRAVENOUS
  Filled 2017-04-04: qty 100

## 2017-04-04 MED ORDER — KETOROLAC TROMETHAMINE 15 MG/ML IJ SOLN
15.0000 mg | INTRAMUSCULAR | Status: DC
  Administered 2017-04-04: 15 mg via INTRAVENOUS
  Filled 2017-04-04: qty 1

## 2017-04-04 MED ORDER — FENTANYL CITRATE (PF) 250 MCG/5ML IJ SOLN
INTRAMUSCULAR | Status: DC | PRN
  Administered 2017-04-04: 50 ug via INTRAVENOUS

## 2017-04-04 MED ORDER — ONDANSETRON HCL 4 MG/2ML IJ SOLN
INTRAMUSCULAR | Status: AC
  Filled 2017-04-04: qty 4

## 2017-04-04 MED ORDER — DEXAMETHASONE SODIUM PHOSPHATE 10 MG/ML IJ SOLN
INTRAMUSCULAR | Status: AC
  Filled 2017-04-04: qty 2

## 2017-04-04 MED ORDER — EPHEDRINE SULFATE 50 MG/ML IJ SOLN
INTRAMUSCULAR | Status: DC | PRN
  Administered 2017-04-04 (×2): 5 mg via INTRAVENOUS

## 2017-04-04 MED ORDER — LIDOCAINE 2% (20 MG/ML) 5 ML SYRINGE
INTRAMUSCULAR | Status: AC
  Filled 2017-04-04: qty 15

## 2017-04-04 MED ORDER — PROPOFOL 10 MG/ML IV BOLUS
INTRAVENOUS | Status: AC
  Filled 2017-04-04: qty 20

## 2017-04-04 MED ORDER — PHENYLEPHRINE 40 MCG/ML (10ML) SYRINGE FOR IV PUSH (FOR BLOOD PRESSURE SUPPORT)
PREFILLED_SYRINGE | INTRAVENOUS | Status: AC
  Filled 2017-04-04: qty 10

## 2017-04-04 MED ORDER — LACTATED RINGERS IV SOLN
INTRAVENOUS | Status: DC
  Administered 2017-04-04: 09:00:00 via INTRAVENOUS

## 2017-04-04 MED ORDER — SCOPOLAMINE 1 MG/3DAYS TD PT72
MEDICATED_PATCH | TRANSDERMAL | Status: AC
  Administered 2017-04-04: 1.5 mg via TRANSDERMAL
  Filled 2017-04-04: qty 1

## 2017-04-04 MED ORDER — LIDOCAINE 2% (20 MG/ML) 5 ML SYRINGE
INTRAMUSCULAR | Status: DC | PRN
  Administered 2017-04-04: 60 mg via INTRAVENOUS

## 2017-04-04 MED ORDER — PROPOFOL 10 MG/ML IV BOLUS
INTRAVENOUS | Status: DC | PRN
  Administered 2017-04-04: 160 mg via INTRAVENOUS

## 2017-04-04 MED ORDER — BUPIVACAINE-EPINEPHRINE (PF) 0.25% -1:200000 IJ SOLN
INTRAMUSCULAR | Status: AC
  Filled 2017-04-04: qty 30

## 2017-04-04 MED ORDER — ACETAMINOPHEN 500 MG PO TABS
1000.0000 mg | ORAL_TABLET | ORAL | Status: AC
  Administered 2017-04-04: 1000 mg via ORAL
  Filled 2017-04-04: qty 2

## 2017-04-04 MED ORDER — ONDANSETRON HCL 4 MG/2ML IJ SOLN: 4.0000 mg | Freq: Once | INTRAMUSCULAR | Status: DC | PRN

## 2017-04-04 MED ORDER — ONDANSETRON HCL 4 MG/2ML IJ SOLN
INTRAMUSCULAR | Status: DC | PRN
  Administered 2017-04-04: 4 mg via INTRAVENOUS

## 2017-04-04 MED ORDER — SCOPOLAMINE 1 MG/3DAYS TD PT72
1.0000 | MEDICATED_PATCH | TRANSDERMAL | Status: DC
  Administered 2017-04-04: 1.5 mg via TRANSDERMAL

## 2017-04-04 MED ORDER — FENTANYL CITRATE (PF) 100 MCG/2ML IJ SOLN
25.0000 ug | INTRAMUSCULAR | Status: DC | PRN
  Administered 2017-04-04: 25 ug via INTRAVENOUS

## 2017-04-04 MED ORDER — MEPERIDINE HCL 25 MG/ML IJ SOLN: 6.2500 mg | INTRAMUSCULAR | Status: DC | PRN

## 2017-04-04 MED ORDER — GABAPENTIN 300 MG PO CAPS
300.0000 mg | ORAL_CAPSULE | ORAL | Status: AC
  Administered 2017-04-04: 300 mg via ORAL
  Filled 2017-04-04: qty 1

## 2017-04-04 MED ORDER — LACTATED RINGERS IV SOLN
INTRAVENOUS | Status: DC | PRN
  Administered 2017-04-04 (×2): via INTRAVENOUS

## 2017-04-04 MED ORDER — PROPOFOL 500 MG/50ML IV EMUL
INTRAVENOUS | Status: DC | PRN
  Administered 2017-04-04: 25 ug/kg/min via INTRAVENOUS

## 2017-04-04 MED ORDER — BUPIVACAINE-EPINEPHRINE 0.25% -1:200000 IJ SOLN
INTRAMUSCULAR | Status: DC | PRN
  Administered 2017-04-04: 30 mL

## 2017-04-04 MED ORDER — PHENYLEPHRINE HCL 10 MG/ML IJ SOLN
INTRAVENOUS | Status: DC | PRN
  Administered 2017-04-04: 25 ug/min via INTRAVENOUS

## 2017-04-04 MED ORDER — 0.9 % SODIUM CHLORIDE (POUR BTL) OPTIME
TOPICAL | Status: DC | PRN
  Administered 2017-04-04: 1000 mL

## 2017-04-04 MED ORDER — MIDAZOLAM HCL 2 MG/2ML IJ SOLN
INTRAMUSCULAR | Status: DC | PRN
  Administered 2017-04-04: 2 mg via INTRAVENOUS

## 2017-04-04 MED ORDER — FENTANYL CITRATE (PF) 250 MCG/5ML IJ SOLN
INTRAMUSCULAR | Status: AC
  Filled 2017-04-04: qty 5

## 2017-04-04 SURGICAL SUPPLY — 57 items
ADH SKN CLS APL DERMABOND .7 (GAUZE/BANDAGES/DRESSINGS) ×1
APPLIER CLIP 9.375 MED OPEN (MISCELLANEOUS)
APR CLP MED 9.3 20 MLT OPN (MISCELLANEOUS)
BINDER BREAST LRG (GAUZE/BANDAGES/DRESSINGS) ×1 IMPLANT
BINDER BREAST XLRG (GAUZE/BANDAGES/DRESSINGS) IMPLANT
BLADE SURG 15 STRL LF DISP TIS (BLADE) ×1 IMPLANT
BLADE SURG 15 STRL SS (BLADE) ×2
CANISTER SUCT 3000ML PPV (MISCELLANEOUS) ×2 IMPLANT
CHLORAPREP W/TINT 26ML (MISCELLANEOUS) ×2 IMPLANT
CLIP APPLIE 9.375 MED OPEN (MISCELLANEOUS) IMPLANT
COVER PROBE W GEL 5X96 (DRAPES) ×2 IMPLANT
COVER SURGICAL LIGHT HANDLE (MISCELLANEOUS) ×2 IMPLANT
DERMABOND ADVANCED (GAUZE/BANDAGES/DRESSINGS) ×1
DERMABOND ADVANCED .7 DNX12 (GAUZE/BANDAGES/DRESSINGS) ×1 IMPLANT
DEVICE DUBIN SPECIMEN MAMMOGRA (MISCELLANEOUS) ×2 IMPLANT
DRAPE CHEST BREAST 15X10 FENES (DRAPES) ×2 IMPLANT
DRAPE UTILITY XL STRL (DRAPES) ×2 IMPLANT
ELECT COATED BLADE 2.86 ST (ELECTRODE) ×2 IMPLANT
ELECT REM PT RETURN 9FT ADLT (ELECTROSURGICAL) ×2
ELECTRODE REM PT RTRN 9FT ADLT (ELECTROSURGICAL) ×1 IMPLANT
GLOVE BIO SURGEON STRL SZ7 (GLOVE) ×4 IMPLANT
GLOVE BIOGEL PI IND STRL 6.5 (GLOVE) IMPLANT
GLOVE BIOGEL PI IND STRL 7.5 (GLOVE) ×1 IMPLANT
GLOVE BIOGEL PI IND STRL 8 (GLOVE) IMPLANT
GLOVE BIOGEL PI IND STRL 8.5 (GLOVE) IMPLANT
GLOVE BIOGEL PI INDICATOR 6.5 (GLOVE) ×1
GLOVE BIOGEL PI INDICATOR 7.5 (GLOVE) ×1
GLOVE BIOGEL PI INDICATOR 8 (GLOVE) ×1
GLOVE BIOGEL PI INDICATOR 8.5 (GLOVE) ×1
GLOVE ECLIPSE 8.0 STRL XLNG CF (GLOVE) ×1 IMPLANT
GOWN STRL REUS W/ TWL LRG LVL3 (GOWN DISPOSABLE) ×2 IMPLANT
GOWN STRL REUS W/ TWL XL LVL3 (GOWN DISPOSABLE) IMPLANT
GOWN STRL REUS W/TWL LRG LVL3 (GOWN DISPOSABLE) ×4
GOWN STRL REUS W/TWL XL LVL3 (GOWN DISPOSABLE) ×2
ILLUMINATOR WAVEGUIDE N/F (MISCELLANEOUS) ×2 IMPLANT
KIT BASIN OR (CUSTOM PROCEDURE TRAY) ×2 IMPLANT
KIT MARKER MARGIN INK (KITS) ×2 IMPLANT
NDL HYPO 25GX1X1/2 BEV (NEEDLE) ×1 IMPLANT
NEEDLE HYPO 25GX1X1/2 BEV (NEEDLE) ×2 IMPLANT
NS IRRIG 1000ML POUR BTL (IV SOLUTION) ×2 IMPLANT
PACK SURGICAL SETUP 50X90 (CUSTOM PROCEDURE TRAY) ×2 IMPLANT
PENCIL BUTTON HOLSTER BLD 10FT (ELECTRODE) ×2 IMPLANT
SPONGE LAP 18X18 X RAY DECT (DISPOSABLE) ×2 IMPLANT
STRIP CLOSURE SKIN 1/2X4 (GAUZE/BANDAGES/DRESSINGS) ×2 IMPLANT
SUT MNCRL AB 4-0 PS2 18 (SUTURE) ×2 IMPLANT
SUT MON AB 5-0 PS2 18 (SUTURE) ×1 IMPLANT
SUT SILK 2 0 SH (SUTURE) ×1 IMPLANT
SUT VIC AB 2-0 SH 27 (SUTURE) ×2
SUT VIC AB 2-0 SH 27XBRD (SUTURE) ×1 IMPLANT
SUT VIC AB 3-0 SH 27 (SUTURE) ×2
SUT VIC AB 3-0 SH 27X BRD (SUTURE) ×1 IMPLANT
SYR BULB 3OZ (MISCELLANEOUS) ×2 IMPLANT
SYR CONTROL 10ML LL (SYRINGE) ×2 IMPLANT
TOWEL OR 17X24 6PK STRL BLUE (TOWEL DISPOSABLE) ×2 IMPLANT
TOWEL OR 17X26 10 PK STRL BLUE (TOWEL DISPOSABLE) ×2 IMPLANT
TUBE CONNECTING 12X1/4 (SUCTIONS) ×2 IMPLANT
YANKAUER SUCT BULB TIP NO VENT (SUCTIONS) ×2 IMPLANT

## 2017-04-04 NOTE — Discharge Instructions (Signed)
[facility] °Office Phone Number [phone] °POST OP INSTRUCTIONS ° °Always review your discharge instruction sheet given to you by the facility where your surgery was performed. ° °IF YOU HAVE DISABILITY OR FAMILY LEAVE FORMS, YOU MUST BRING THEM TO THE OFFICE FOR PROCESSING.  DO NOT GIVE THEM TO YOUR DOCTOR. ° °1. A prescription for pain medication may be given to you upon discharge.  Take your pain medication as prescribed, if needed.  If narcotic pain medicine is not needed, then you may take acetaminophen (Tylenol), naprosyn (Alleve) or ibuprofen (Advil) as needed. °2. Take your usually prescribed medications unless otherwise directed °3. If you need a refill on your pain medication, please contact your pharmacy.  They will contact our office to request authorization.  Prescriptions will not be filled after 5pm or on week-ends. °4. You should eat very light the first 24 hours after surgery, such as soup, crackers, pudding, etc.  Resume your normal diet the day after surgery. °5. Most patients will experience some swelling and bruising in the breast.  Ice packs and a good support bra will help.  Wear the breast binder provided or a sports bra for 72 hours day and night.  After that wear a sports bra during the day until you return to the office. Swelling and bruising can take several days to resolve.  °6. It is common to experience some constipation if taking pain medication after surgery.  Increasing fluid intake and taking a stool softener will usually help or prevent this problem from occurring.  A mild laxative (Milk of Magnesia or Miralax) should be taken according to package directions if there are no bowel movements after 48 hours. °7. Unless discharge instructions indicate otherwise, you may remove your bandages 48 hours after surgery and you may shower at that time.  You may have steri-strips (small skin tapes) in place directly over the incision.  These strips should be left on the  skin for 7-10 days and will come off on their own.  If your surgeon used skin glue on the incision, you may shower in 24 hours.  The glue will flake off over the next 2-3 weeks.  Any sutures or staples will be removed at the office during your follow-up visit. °8. ACTIVITIES:  You may resume regular daily activities (gradually increasing) beginning the next day.  Wearing a good support bra or sports bra minimizes pain and swelling.  You may have sexual intercourse when it is comfortable. °a. You may drive when you no longer are taking prescription pain medication, you can comfortably wear a seatbelt, and you can safely maneuver your car and apply brakes. °b. RETURN TO WORK:  ______________________________________________________________________________________ °9. You should see your doctor in the office for a follow-up appointment approximately two weeks after your surgery.  Your doctor’s nurse will typically make your follow-up appointment when she calls you with your pathology report.  Expect your pathology report 3-4 business days after your surgery.  You may call to check if you do not hear from us after three days. °10. OTHER INSTRUCTIONS: _______________________________________________________________________________________________ _____________________________________________________________________________________________________________________________________ °_____________________________________________________________________________________________________________________________________ °_____________________________________________________________________________________________________________________________________ ° °WHEN TO CALL DR Erby Sanderson: °1. Fever over 101.0 °2. Nausea and/or vomiting. °3. Extreme swelling or bruising. °4. Continued bleeding from incision. °5. Increased pain, redness, or drainage from the incision. ° °The clinic staff is available to answer your questions during regular  business hours.  Please don’t hesitate to call and ask to speak to one of the nurses for clinical concerns.  If you   have a medical emergency, go to the nearest emergency room or call 911.  A surgeon from [facility] is always on call at the hospital. ° °For further questions, please visit [email or web address] ° °

## 2017-04-04 NOTE — Anesthesia Postprocedure Evaluation (Signed)
Anesthesia Post Note  Patient: Michelle Fernandez  Procedure(s) Performed: RIGHT BREAST LUMPECTOMY WITH RADIOACTIVE SEED LOCALIZATION (Right Breast)     Patient location during evaluation: PACU Anesthesia Type: General Level of consciousness: awake and alert Pain management: pain level controlled Vital Signs Assessment: post-procedure vital signs reviewed and stable Respiratory status: spontaneous breathing, nonlabored ventilation, respiratory function stable and patient connected to nasal cannula oxygen Cardiovascular status: blood pressure returned to baseline and stable Postop Assessment: no apparent nausea or vomiting Anesthetic complications: no    Last Vitals:  Vitals:   04/04/17 1050 04/04/17 1051  BP:    Pulse:  71  Resp:  15  Temp:    SpO2: 100% 100%    Last Pain:  Vitals:   04/04/17 1042  TempSrc:   PainSc: Asleep                 Diamonds Lippard

## 2017-04-04 NOTE — Interval H&P Note (Signed)
History and Physical Interval Note:  04/04/2017 9:11 AM  Michelle Fernandez  has presented today for surgery, with the diagnosis of RIGHT BREAST DCIS  The various methods of treatment have been discussed with the patient and family. After consideration of risks, benefits and other options for treatment, the patient has consented to  Procedure(s): RIGHT BREAST LUMPECTOMY WITH RADIOACTIVE SEED LOCALIZATION (Right) as a surgical intervention .  The patient's history has been reviewed, patient examined, no change in status, stable for surgery.  I have reviewed the patient's chart and labs.  Questions were answered to the patient's satisfaction.     Rolm Bookbinder

## 2017-04-04 NOTE — Transfer of Care (Signed)
Immediate Anesthesia Transfer of Care Note  Patient: Michelle Fernandez  Procedure(s) Performed: RIGHT BREAST LUMPECTOMY WITH RADIOACTIVE SEED LOCALIZATION (Right Breast)  Patient Location: PACU  Anesthesia Type:General  Level of Consciousness: awake and drowsy  Airway & Oxygen Therapy: Patient Spontanous Breathing and Patient connected to nasal cannula oxygen  Post-op Assessment: Report given to RN and Post -op Vital signs reviewed and stable  Post vital signs: Reviewed and stable  Last Vitals:  Vitals:   04/04/17 0806 04/04/17 1042  BP: (!) 165/81   Pulse: 80   Resp: 18   Temp: 36.6 C (!) (P) 36.4 C  SpO2: 94%     Last Pain:  Vitals:   04/04/17 0806  TempSrc: Oral         Complications: No apparent anesthesia complications

## 2017-04-04 NOTE — Op Note (Signed)
Preoperative diagnosis: Rightbreast cancer, clinical stage 0 Postoperative diagnosis: same as above Procedure:Rightbreast seed guided lumpectomy Surgeon: Dr Serita Grammes PYP:[id number] Anes: general  Specimens: Rightbreast tissue marked with paint Complications none Drains none Sponge count correct Dispo to pacu stable  Indications: This is a56yof with a newly diagnosed clinical stage 0rightbreast cancer. We discussed options and have elected to proceed with seed guided lumpectomy.  Procedure: After informed consent was obtained the patient was taken to the operating room.She was given antibiotics. Sequential compression devices were on her legs. She was then placed under general anesthesia with an LMA. Then she was prepped and draped in the standard sterile surgical fashion. Surgical timeout was then performed.  I then located the seed in theretroareolar rightbreastI infiltrated marcaine in the skin and then madeand incision around the areola to hide the scar later.I then used the neoprobe to remove the seed and the surrounding tissue with attempt to get clear margins. I marked this with paint. MM confirmed removal of seed and theclip. I placed clips in the cavity.I then obtained hemostasis. This was marked as above.I approximated the breast tissue with 2-0 vicryl. The skin was closed with 3-0 vicryl and 4-0 monocryl. Glue and steristrips were applied.

## 2017-04-04 NOTE — H&P (Signed)
Michelle Fernandez referred by Dr Forde Dandy for newly diagnosed right breast dcis. was seen in [facility] already. genetics is negative she has fh in mom. she underwent screening mm that has right breast subareolar calcs. she was undergoing six month follow up for other calcs that are stable. US of the axilla is negative. she underwent biopsy that is grade II DCIS that is er/pr positive. she was offered comet trial but has declined   Past Surgical History Breast Biopsy  Right. Gallbladder Surgery - Laparoscopic  Hysterectomy (due to cancer) - Partial  Knee Surgery  Right. Oral Surgery  Tonsillectomy  Ventral / Umbilical Hernia Surgery  Bilateral.  Diagnostic Studies History  Colonoscopy  never Mammogram  within last year Pap Smear  1-5 years ago  Medication History none  Social History  Caffeine use  Carbonated beverages, Coffee, Tea. No alcohol use  No drug use  Tobacco use  Former smoker.  Family History  Anesthetic complications  Sister. Arthritis  Mother, Sister. Bleeding disorder  Family Members In General, Father. Breast Cancer  Family Members In General, Mother. Depression  Sister. Diabetes Mellitus  Mother. Heart Disease  Father, Mother. Hypertension  Mother. Melanoma  Sister. Respiratory Condition  Mother. Thyroid problems  Mother, Sister.  Pregnancy / Birth History Age at menarche  39 years. Age of menopause  <43 Gravida  2 Irregular periods  Maternal age  77-25 Para  2  Other Problems Breast Cancer  Cholelithiasis  Gastroesophageal Reflux Disease  General anesthesia - complications  High blood pressure  Hypercholesterolemia  Lump In Breast  Migraine Headache  Thyroid Disease  Ventral Hernia Repair    Review of Systems General Present- Weight Loss. Not Present- Appetite Loss, Chills, Fatigue, Fever, Night Sweats and Weight Gain. Skin Not Present- Change in Wart/Mole, Dryness, Hives, Jaundice, New Lesions, Non-Healing  Wounds, Rash and Ulcer. HEENT Present- Wears glasses/contact lenses. Not Present- Earache, Hearing Loss, Hoarseness, Nose Bleed, Oral Ulcers, Ringing in the Ears, Seasonal Allergies, Sinus Pain, Sore Throat, Visual Disturbances and Yellow Eyes. Respiratory Not Present- Bloody sputum, Chronic Cough, Difficulty Breathing, Snoring and Wheezing. Breast Present- Breast Pain. Not Present- Breast Mass, Nipple Discharge and Skin Changes. Gastrointestinal Not Present- Abdominal Pain, Bloating, Bloody Stool, Change in Bowel Habits, Chronic diarrhea, Constipation, Difficulty Swallowing, Excessive gas, Gets full quickly at meals, Hemorrhoids, Indigestion, Nausea, Rectal Pain and Vomiting. Female Genitourinary Not Present- Frequency, Nocturia, Painful Urination, Pelvic Pain and Urgency. Neurological Present- Tremor. Not Present- Decreased Memory, Fainting, Headaches, Numbness, Seizures, Tingling, Trouble walking and Weakness. Psychiatric Not Present- Anxiety, Bipolar, Change in Sleep Pattern, Depression, Fearful and Frequent crying. Endocrine Present- Hot flashes. Not Present- Cold Intolerance, Excessive Hunger, Hair Changes, Heat Intolerance and New Diabetes. Hematology Present- Easy Bruising. Not Present- Blood Thinners, Excessive bleeding, Gland problems, HIV and Persistent Infections.   Physical Exam General Mental Status-Alert. Orientation-Oriented X3. Head and Neck Trachea-midline. Thyroid Gland Characteristics - normal size and consistency. Eye Sclera/Conjunctiva - Bilateral-No scleral icterus. Chest and Lung Exam Chest and lung exam reveals -quiet, even and easy respiratory effort with no use of accessory muscles and on auscultation, normal breath sounds, no adventitious sounds and normal vocal resonance. Breast Nipples-No Discharge. Breast Lump-No Palpable Breast Mass. Cardiovascular Cardiovascular examination reveals -normal heart sounds, regular rate and rhythm with no  murmurs. Abdomen Note: soft no hm Lymphatic Head & Neck General Head & Neck Lymphatics: Bilateral - Description - Normal. Axillary General Axillary Region: Bilateral - Description - Normal. Note: no [place] adenopathy   Assessment & Plan BREAST  NEOPLASM, TIS (DCIS), RIGHT (D05.11) Story: Right breast seed guided lumpectomy No need for node biopsy given dcis. We discussed the options for treatment of the breast cancer which included lumpectomy versus a mastectomy. We discussed the performance of the lumpectomy with radioactive seed placement. We discussed a 5-10% chance of a positive margin requiring reexcision in the operating room. We also discussed that she will likely need radiation therapy if she undergoes lumpectomy. The breast cannot undergo more radiation therapy in the same breast after lumpectomy in the future. We discussed the mastectomy (removal of whole breast) and the postoperative care for that as well. Mastectomy can be followed by reconstruction. This is a more extensive surgery and requires more recovery. The decision for lumpectomy vs mastectomy has no impact on decision for chemotherapy. Most mastectomy patients will not need radiation therapy. We discussed that there is no difference in her survival whether she undergoes lumpectomy with radiation therapy or antiestrogen therapy versus a mastectomy. There is also no real difference between her recurrence in the breast. We discussed the risks of operation including bleeding, infection, possible reoperation. She understands her further therapy will be based on what her stages at the time of her operation.

## 2017-04-04 NOTE — Anesthesia Procedure Notes (Signed)
Procedure Name: LMA Insertion Date/Time: 04/04/2017 9:49 AM Performed by: Bryson Corona, CRNA Pre-anesthesia Checklist: Patient identified, Emergency Drugs available, Suction available and Patient being monitored Patient Re-evaluated:Patient Re-evaluated prior to induction Oxygen Delivery Method: Circle System Utilized Preoxygenation: Pre-oxygenation with 100% oxygen Induction Type: IV induction Ventilation: Mask ventilation without difficulty LMA: LMA inserted LMA Size: 4.0 Number of attempts: 1 Airway Equipment and Method: Bite block Placement Confirmation: positive ETCO2 Tube secured with: Tape Dental Injury: Teeth and Oropharynx as per pre-operative assessment

## 2017-04-05 ENCOUNTER — Encounter (HOSPITAL_COMMUNITY): Payer: Self-pay | Admitting: General Surgery

## 2017-04-12 NOTE — Progress Notes (Signed)
[facility]  [phone] [phone]     ID: Michelle Fernandez DOB: June 12, 1960  MR#: [id number]  EXH#:[id number]  Patient Care Team: Reynold Bowen, MD as PCP - General (Endocrinology) Rolm Bookbinder, MD as Consulting Physician (General Surgery) Harsha Yusko, Virgie Dad, MD as Consulting Physician (Oncology) Gery Pray, MD as Consulting Physician (Radiation Oncology) OTHER MD:  CHIEF COMPLAINT: Ductal carcinoma in situ, estrogen receptor positive  CURRENT TREATMENT: Adjuvant radiation pending; to start tamoxifen 07/01/2016   HISTORY OF CURRENT ILLNESS: From the original intake note:  Michelle Fernandez had routine screening mammography at [facility] 01/14/2016 showing an area of grouped calcifications in the central right breast. She was set up for six-month follow-up 07/20/2016 and this showed them to be stable, and probably benign, but repeat 6 month follow-up was recommended and on 01/25/2017 the patient underwent bilateral diagnostic mammography showing the breast density to be category C. In the right breast retroareolar area there was a new group of amorphous calcifications measuring 0.6 cm. Biopsy of this area 02/01/2017 showed ([id number]) ductal carcinoma in situ, intermediate grade, estrogen and progesterone receptor positive.  Right axillary ultrasound 02/01/2017 was sonographically benign  The patient's subsequent history is as detailed below.  INTERVAL HISTORY: Michelle Fernandez returns today for follow-up of her ductal carcinoma in situ.  Since her last visit here she had her right lumpectomy, on 04/04/2017.  This showed (SZA [phone]) atypical ductal hyperplasia.  There was no residual ductal carcinoma in situ. She tolerated the procedure well.  Since her last visit here she also underwent genetic testing which showed no deleterious mutations.   REVIEW OF SYSTEMS:  Michelle Fernandez is doing well. She reports going for walks when she is at work and adds that she quit  smoking. She states that she only smoked a little when she was stressed or nervous during treatment. She denies unusual headaches, visual changes, nausea, vomiting, or dizziness. There has been no unusual cough, phlegm production, or pleurisy. This been no change in bowel or bladder habits. She denies unexplained fatigue or unexplained weight loss, bleeding, rash, or fever. A detailed review of systems was otherwise entirely stable.    PAST MEDICAL HISTORY: Past Medical History:  Diagnosis Date  . Breast cancer ([place])   . Endometriosis   . Family history of breast cancer   . Genetic testing 02/10/2017   STAT Breast panel with reflex to Common Cancers panel (47 genes) - No pathogenic mutations detected  . Graves disease   . 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 Hypertension   . Migraine   . Personal history of asthma   . PONV (postoperative nausea and vomiting)    "really sick when I wake up"    PAST SURGICAL HISTORY: Past Surgical History:  Procedure Laterality Date  . ANTERIOR CRUCIATE LIGAMENT REPAIR Left 1991  . BREAST LUMPECTOMY WITH RADIOACTIVE SEED LOCALIZATION Right 04/04/2017   Procedure: RIGHT BREAST LUMPECTOMY WITH RADIOACTIVE SEED LOCALIZATION;  Surgeon: Rolm Bookbinder, MD;  Location: [facility];  Service: General;  Laterality: Right;  . CHOLECYSTECTOMY, LAPAROSCOPIC  2001  . Page  2001   from lap chole  . MENISCUS REPAIR Left 1993  . ULNAR TUNNEL RELEASE Right   . VAGINAL HYSTERECTOMY  1988   for endometriosos    FAMILY HISTORY Family History  Problem Relation Age of Onset  . Breast cancer Mother 42       TAH/BSO 19s; currently 46  . Diabetes Mellitus I Mother   . Lung cancer Mother  9  . Dementia Mother   . Heart attack Father   . Breast cancer Maternal Aunt 57       currently 27s  . Multiple myeloma Sister 39  . Cancer Maternal Uncle        unk. type; deceased 73s  . Cancer Cousin        son of mat uncle with cancer; unk. type; deceased 48s  Her  father died at age 70 from a MI. Her mother is 55 years old as of October 2018. The patient's mother has a history of lung cancer diagnosed at age 3 and breast cancer diagnosed at age 62. Pt has 3 sisters.  maternal aunt was diagnosed at age 58 with breast cancer.   GYNECOLOGIC HISTORY:  Patient's last menstrual period was 05/03/1986. Menarche: 56 years old Age at first live birth: 56 years old GP: GXP2 LMP: At age 56 due to a partial hysterectomy (no salpingo-oophorectomy). 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 She lives at home with her Husband, Louie Casa, who is a Administrator. She has an outside dog. aughter [employer] lives in [place] and works as a Theme park manager. Daughter [place] lives in [place] and is a "stay at home mom". The patient has 6 grandchildren, all boys.   ADVANCED DIRECTIVES: Yes, her husband is her healthcare POA.    HEALTH MAINTENANCE: Social History   Tobacco Use  . Smoking status: Former Smoker    Types: Cigarettes    Last attempt to quit: 05/05/2006    Years since quitting: 10.9  . Smokeless tobacco: Never Used  Substance Use Topics  . Alcohol use: No  . Drug use: No     Colonoscopy: No  PAP: Last on 03/18/2015 and negative.  Bone density: No   Allergies  Allergen Reactions  . Phenergan [Promethazine Hcl] Nausea Only    Current Outpatient Medications  Medication Sig Dispense Refill  . estradiol (VIVELLE-DOT) 0.05 MG/24HR patch Place 1 patch (0.05 mg total) onto the skin 2 (two) times a week. (Patient not taking: Reported on 03/21/2017) 24 patch 4  . hydrochlorothiazide (HYDRODIURIL) 25 MG tablet Take 25 mg by mouth daily.    Marland Kitchen ibuprofen (ADVIL,MOTRIN) 200 MG tablet Take 800 mg every 6 (six) hours as needed by mouth for headache or moderate pain.    . metoprolol succinate (TOPROL-XL) 25 MG 24 hr tablet Take 12.5 mg daily by mouth.     . rosuvastatin (CRESTOR) 10 MG tablet Take 10 mg daily by  mouth.     . SYNTHROID 125 MCG tablet Take 125 mcg by mouth daily.  6  . venlafaxine XR (EFFEXOR-XR) 75 MG 24 hr capsule Take 1 capsule (75 mg total) by mouth daily with breakfast. 90 capsule 3  . ZETIA 10 MG tablet Take 10 mg daily by mouth.      No current facility-administered medications for this visit.     OBJECTIVE: Middle-aged white woman who appears well  Vitals:   04/13/17 0920  BP: (!) 155/83  Pulse: 78  Resp: 18  Temp: 98.1 F (36.7 C)  SpO2: 99%     Body mass index is 24.61 kg/m.   Wt Readings from Last 3 Encounters:  04/13/17 138 lb 14.4 oz (63 kg)  04/04/17 133 lb 12.8 oz (60.7 kg)  03/28/17 133 lb 12.8 oz (60.7 kg)      ECOG FS:0 - Asymptomatic  Sclerae unicteric, pupils round and equal Oropharynx clear and moist  No cervical or supraclavicular adenopathy Lungs no rales or rhonchi Heart regular rate and rhythm Abd soft, nontender, positive bowel sounds MSK no focal spinal tenderness, no upper extremity lymphedema Neuro: nonfocal, well oriented, appropriate affect Breasts: The right breast is status post lumpectomy.  The Steri-Strips are still in place.  The cosmetic result is excellent.  The left breast is benign.  Both axillae are benign.  LAB RESULTS:  CMP     Component Value Date/Time   NA 138 03/28/2017 0900   NA 142 02/09/2017 0846   K 3.6 03/28/2017 0900   K 3.3 (L) 02/09/2017 0846   CL 103 03/28/2017 0900   CO2 26 03/28/2017 0900   CO2 27 02/09/2017 0846   GLUCOSE 87 03/28/2017 0900   GLUCOSE 87 02/09/2017 0846   BUN <5 (L) 03/28/2017 0900   BUN 6.8 (L) 02/09/2017 0846   CREATININE 0.61 03/28/2017 0900   CREATININE 0.7 02/09/2017 0846   CALCIUM 9.5 03/28/2017 0900   CALCIUM 10.2 02/09/2017 0846   PROT 7.6 02/09/2017 0846   ALBUMIN 4.3 02/09/2017 0846   AST 21 02/09/2017 0846   ALT 25 02/09/2017 0846   ALKPHOS 58 02/09/2017 0846   BILITOT 0.53 02/09/2017 0846   GFRNONAA >60 03/28/2017 0900   GFRAA >60 03/28/2017 0900    No  results found for: TOTALPROTELP, ALBUMINELP, A1GS, A2GS, BETS, BETA2SER, GAMS, MSPIKE, SPEI  No results found for: KPAFRELGTCHN, LAMBDASER, KAPLAMBRATIO  Lab Results  Component Value Date   WBC 5.4 03/28/2017   NEUTROABS 3.5 02/09/2017   HGB 14.8 03/28/2017   HCT 43.7 03/28/2017   MCV 89.4 03/28/2017   PLT 121 (L) 03/28/2017    _0 @    No results found for: LABCA2  No components found for: [id number]  No results for input(s): INR in the last 168 hours.  No results found for: LABCA2  No results found for: XBJ478  No results found for: GNF621  No results found for: HYQ657  No results found for: CA2729  No components found for: HGQUANT  No results found for: CEA1 / No results found for: CEA1   No results found for: AFPTUMOR  No results found for: CHROMOGRNA  No results found for: PSA1  No visits with results within 3 Day(s) from this visit.  Latest known visit with results is:  Hospital Outpatient Visit on 03/28/2017  Component Date Value Ref Range Status  . Sodium 03/28/2017 138  135 - 145 mmol/L Final  . Potassium 03/28/2017 3.6  3.5 - 5.1 mmol/L Final  . Chloride 03/28/2017 103  101 - 111 mmol/L Final  . CO2 03/28/2017 26  22 - 32 mmol/L Final  . Glucose, Bld 03/28/2017 87  65 - 99 mg/dL Final  . BUN 03/28/2017 <5* 6 - 20 mg/dL Final  . Creatinine, Ser 03/28/2017 0.61  0.44 - 1.00 mg/dL Final  . Calcium 03/28/2017 9.5  8.9 - 10.3 mg/dL Final  . GFR calc non Af Amer 03/28/2017 >60  >60 mL/min Final  . GFR calc Af Amer 03/28/2017 >60  >60 mL/min Final   Comment: (NOTE) The eGFR has been calculated using the CKD EPI equation. This calculation has not been validated in all clinical situations. eGFR's persistently <60 mL/min signify possible Chronic Kidney Disease.   . Anion gap 03/28/2017 9  5 - 15 Final  . WBC 03/28/2017 5.4  4.0 - 10.5 K/uL Final  . RBC 03/28/2017 4.89  3.87 - 5.11 MIL/uL Final  . Hemoglobin 03/28/2017 14.8  12.0 -  15.0 g/dL  Final  . HCT 03/28/2017 43.7  36.0 - 46.0 % Final  . MCV 03/28/2017 89.4  78.0 - 100.0 fL Final  . MCH 03/28/2017 30.3  26.0 - 34.0 pg Final  . MCHC 03/28/2017 33.9  30.0 - 36.0 g/dL Final  . RDW 03/28/2017 12.6  11.5 - 15.5 % Final  . Platelets 03/28/2017 121* 150 - 400 K/uL Final    (this displays the last labs from the last 3 days)  No results found for: TOTALPROTELP, ALBUMINELP, A1GS, A2GS, BETS, BETA2SER, GAMS, MSPIKE, SPEI (this displays SPEP labs)  No results found for: KPAFRELGTCHN, LAMBDASER, KAPLAMBRATIO (kappa/lambda light chains)  No results found for: HGBA, HGBA2QUANT, HGBFQUANT, HGBSQUAN (Hemoglobinopathy evaluation)   No results found for: LDH  No results found for: IRON, TIBC, IRONPCTSAT (Iron and TIBC)  No results found for: FERRITIN  Urinalysis    Component Value Date/Time   BILIRUBINUR N 03/24/2016 0917   PROTEINUR N 03/24/2016 0917   UROBILINOGEN negative 03/24/2016 0917   NITRITE N 03/24/2016 0917   LEUKOCYTESUR Negative 03/24/2016 0917     STUDIES: Iron studies discussed  ELIGIBLE FOR AVAILABLE RESEARCH PROTOCOL: COMET, declined  ASSESSMENT: 56 y.o. [place] woman Status post right breast retroareolar biopsy 02/01/2017 for ductal carcinoma in situ grade 2, estrogen and progesterone receptor positive  (1) genetics testing through the STAT Breast panel with reflex to  [facility]Common Cancers panel (47 genes) -found no pathogenic mutations in APC, ATM, AXIN2, BARD1, BMPR1A, BRCA1, BRCA2, BRIP1, CDH1, CDK4, CDKN2A, CHEK2, CTNNA1, DICER1, EPCAM, GREM1, HOXB13, KIT, MEN1, MLH1, MSH2, MSH3, MSH6, MUTYH, NBN, NF1, NTHL1, PALB2, PDGFRA, PMS2, POLD1, POLE, PTEN, RAD50, RAD51C, RAD51D, SDHA, SDHB, SDHC, SDHD, SMAD4, SMARCA4, STK11, TP53, TSC1, TSC2, VHL.   (2) post right lumpectomy 04/04/2017 showing only residual atypical ductal hyperplasia  (3) adjuvant radiation to follow  (4) to start tamoxifen 07/01/2017  (5) intermittent tobacco abuse: Has  definitively quit smoking as of September 2018  PLAN: I spent approximately 30 minutes with Michelle Fernandez with most of that time spent discussing her overall situation.  We reviewed her pathology.  She understands she had no residual cancer in the breast and so we ended up with a small area of noninvasive breast cancer.  She wondered if she needed to have radiation.  I quoted her a risk of local recurrence in the 24% range without radiation and in the 8% range with.  She can check these percentages with Dr. Sondra Come when she sees him but my recommendation is that she do receive radiation.  A more complex question is whether she should receive systemic therapy.  Of course her breast cancer was estrogen receptor positive and antiestrogens will cut there is a local risk of recurrence in half stay from 8% to 4%.  A more compelling reason to consider antiestrogens is the risk of developing a new breast cancer in the future.  This approaches 1 %/year.  We then discussed antiestrogens in detail.  Specifically we reviewed the difference between tamoxifen and anastrozole in detail. She understands that anastrozole and the aromatase inhibitors in general work by blocking estrogen production. Accordingly vaginal dryness, decrease in bone density, and of course hot flashes can result. The aromatase inhibitors can also negatively affect the cholesterol profile, although that is a minor effect. One out of 5 women on aromatase inhibitors we will feel "old and achy". This arthralgia/myalgia syndrome, which resembles fibromyalgia clinically, does resolve with stopping the medications. Accordingly this is not a reason to not try an  aromatase inhibitor but it is a frequent reason to stop it (in other words 20% of women will not be able to tolerate these medications).  Tamoxifen on the other hand does not block estrogen production. It does not "take away a woman's estrogen". It blocks the estrogen receptor in breast cells. Like  anastrozole, it can also cause hot flashes. As opposed to anastrozole, tamoxifen has many estrogen-like effects. It is technically an estrogen receptor modulator. This means that in some tissues tamoxifen works like estrogen-- for example it helps strengthen the bones. It tends to improve the cholesterol profile. It can cause thickening of the endometrial lining, and even endometrial polyps or rarely cancer of the uterus.(The risk of uterine cancer due to tamoxifen is one additional cancer per thousand women year). It can cause vaginal wetness or stickiness. It can cause blood clots through this estrogen-like effect--the risk of blood clots with tamoxifen is exactly the same as with birth control pills or hormone replacement.  Neither of these agents causes mood changes or weight gain, despite the popular belief that they can have these side effects. We have data from studies comparing either of these drugs with placebo, and in those cases the control group had the same amount of weight gain and depression as the group that took the drug.  Since she is status post hysterectomy and since she took, I think tamoxifen would be a very good choice for her.  She is very motivated.  We are going to wait for her to recover after radiation and therefore the target date to start tamoxifen will be 07/01/2016.  She will see me a few weeks later.  If she is tolerating tamoxifen well the plan will be to continue that for a total of 5 years  She has a good understanding of the overall plan.  She agrees with it.  She knows the goal of treatment in her case is cure as well as prevention.  She will call with any issues that may develop before the next visit.       Ryn Peine, Virgie Dad, MD  04/13/17 9:53 AM Medical Oncology and Hematology [facility] [address] [place], [place], [place] [address] Tel. [phone]    Fax. [phone]  This document serves as a record of services personally performed by  Chauncey Cruel, MD. It was created on his behalf by Margit Banda, a trained medical scribe. The creation of this record is based on the scribe's personal observations and the provider's statements to them.   I have reviewed the above documentation for accuracy and completeness, and I agree with the above.

## 2017-04-13 ENCOUNTER — Telehealth: Payer: Self-pay | Admitting: Oncology

## 2017-04-13 ENCOUNTER — Ambulatory Visit: Payer: BLUE CROSS/BLUE SHIELD | Admitting: Oncology

## 2017-04-13 VITALS — BP 155/83 | HR 78 | Temp 98.1°F | Resp 18 | Ht 63.0 in | Wt 138.9 lb

## 2017-04-13 DIAGNOSIS — D0511 Intraductal carcinoma in situ of right breast: Secondary | ICD-10-CM | POA: Diagnosis not present

## 2017-04-13 DIAGNOSIS — Z170 Estrogen receptor positive status [ER+]: Secondary | ICD-10-CM | POA: Diagnosis not present

## 2017-04-13 DIAGNOSIS — Z79810 Long term (current) use of selective estrogen receptor modulators (SERMs): Secondary | ICD-10-CM | POA: Diagnosis not present

## 2017-04-13 NOTE — Telephone Encounter (Signed)
Scheduled appt per 12/12 los - patient did not want avs or calendar.

## 2017-04-15 NOTE — Progress Notes (Signed)
Location of Breast Cancer: Ductal carcinoma in situ (DCIS) of right breast. DCIS  Histology per Pathology Report:   04/04/17 Diagnosis Breast, lumpectomy, right - ATYPICAL DUCTAL HYPERPLASIA. - RADIAL SCAR WITH CALCIFICATIONS. - SEE ONCOLOGY TABLE BELOW.  02/01/17 Diagnosis Breast, right, needle core biopsy, retroareolar calcifications - DUCTAL CARCINOMA IN SITU. - COMPLEX SCLEROSING LESION WITH CALCIFICATIONS.  Receptor Status: ER(50%), PR (40%), Her2-neu (), Ki-()  Did patient present with symptoms (if so, please note symptoms) or was this found on screening mammography?: screening mammogram  Past/Anticipated interventions by surgeon, if any: 04/04/17 - Procedure: RIGHT BREAST LUMPECTOMY WITH RADIOACTIVE SEED LOCALIZATION;  Surgeon: Rolm Bookbinder, MD  Past/Anticipated interventions by medical oncology, if any: to start tamoxifen 07/01/2016  Lymphedema issues, if any:  no  Pain issues, if any:  Has some soreness from surgery along with occasional shooting pains in her right breast.  SAFETY ISSUES:  Prior radiation? no  Pacemaker/ICD? no  Possible current pregnancy?no  Is the patient on methotrexate? no  Current Complaints / other details:  Patient is here with her daughter.    BP (!) 149/104 (BP Location: Right Arm, Patient Position: Sitting)   Pulse 78   Temp 98.4 F (36.9 C) (Oral)   Ht 5' 3"  (1.6 m)   Wt 138 lb 12.8 oz (63 kg)   LMP 05/03/1986   SpO2 99%   BMI 24.59 kg/m    Wt Readings from Last 3 Encounters:  04/18/17 138 lb 12.8 oz (63 kg)  04/13/17 138 lb 14.4 oz (63 kg)  04/04/17 133 lb 12.8 oz (60.7 kg)      Michelle Liner, RN 04/15/2017,8:21 AM

## 2017-04-18 ENCOUNTER — Ambulatory Visit
Admission: RE | Admit: 2017-04-18 | Discharge: 2017-04-18 | Disposition: A | Discharge status: Home / Self Care | Payer: BLUE CROSS/BLUE SHIELD | Source: Ambulatory Visit | Attending: Radiation Oncology | Admitting: Radiation Oncology

## 2017-04-18 ENCOUNTER — Other Ambulatory Visit: Payer: Self-pay

## 2017-04-18 ENCOUNTER — Encounter: Payer: Self-pay | Admitting: Radiation Oncology

## 2017-04-18 DIAGNOSIS — Z87891 Personal history of nicotine dependence: Secondary | ICD-10-CM | POA: Diagnosis not present

## 2017-04-18 DIAGNOSIS — Z801 Family history of malignant neoplasm of trachea, bronchus and lung: Secondary | ICD-10-CM | POA: Diagnosis not present

## 2017-04-18 DIAGNOSIS — Z888 Allergy status to other drugs, medicaments and biological substances status: Secondary | ICD-10-CM | POA: Diagnosis not present

## 2017-04-18 DIAGNOSIS — Z9889 Other specified postprocedural states: Secondary | ICD-10-CM | POA: Diagnosis not present

## 2017-04-18 DIAGNOSIS — Z8249 Family history of ischemic heart disease and other diseases of the circulatory system: Secondary | ICD-10-CM | POA: Diagnosis not present

## 2017-04-18 DIAGNOSIS — D0511 Intraductal carcinoma in situ of right breast: Secondary | ICD-10-CM | POA: Diagnosis not present

## 2017-04-18 DIAGNOSIS — N809 Endometriosis, unspecified: Secondary | ICD-10-CM | POA: Diagnosis not present

## 2017-04-18 DIAGNOSIS — J45909 Unspecified asthma, uncomplicated: Secondary | ICD-10-CM | POA: Diagnosis not present

## 2017-04-18 DIAGNOSIS — Z803 Family history of malignant neoplasm of breast: Secondary | ICD-10-CM | POA: Diagnosis not present

## 2017-04-18 DIAGNOSIS — Z833 Family history of diabetes mellitus: Secondary | ICD-10-CM | POA: Diagnosis not present

## 2017-04-18 DIAGNOSIS — E0500 Thyrotoxicosis with diffuse goiter without thyrotoxic crisis or storm: Secondary | ICD-10-CM | POA: Diagnosis not present

## 2017-04-18 DIAGNOSIS — Z510 Encounter for antineoplastic radiation therapy: Secondary | ICD-10-CM | POA: Diagnosis not present

## 2017-04-18 DIAGNOSIS — Z79899 Other long term (current) drug therapy: Secondary | ICD-10-CM | POA: Diagnosis not present

## 2017-04-18 DIAGNOSIS — Z791 Long term (current) use of non-steroidal anti-inflammatories (NSAID): Secondary | ICD-10-CM | POA: Diagnosis not present

## 2017-04-18 DIAGNOSIS — K219 Gastro-esophageal reflux disease without esophagitis: Secondary | ICD-10-CM | POA: Diagnosis not present

## 2017-04-18 NOTE — Progress Notes (Signed)
Radiation Oncology         [phone] ________________________________  Name: Michelle Fernandez MRN: [id number]  Date: 04/18/2017  DOB: 06-14-1960  Re-evaluation Note  CC: Reynold Bowen, MD  Magrinat, Virgie Dad, MD    ICD-10-CM   1. Ductal carcinoma in situ (DCIS) of right breast D05.11     Diagnosis:  Ductal carcinoma in situ (DCIS) of right breast. DCIS, intermediate grade    Narrative:  The patient returns today for further evaluation. Since her initial consultation in the breast clinic on 02/09/2017 the patient has undergone her definitive surgery by Dr. Rolm Bookbinder.  The patient's lumpectomy specimen did not show any residual ductal carcinoma in situ or invasive breast cancer. The lumpectomy specimen did show atypical ductal hyperplasia.   Patient did meet with Dr. Jana Hakim who is recommending tamoxifen and adjuvant radiation therapy given the patient's young age.                             ALLERGIES:  is allergic to phenergan [promethazine hcl].  Meds: Current Outpatient Medications  Medication Sig Dispense Refill  . hydrochlorothiazide (HYDRODIURIL) 25 MG tablet Take 25 mg by mouth daily.    Marland Kitchen ibuprofen (ADVIL,MOTRIN) 200 MG tablet Take 800 mg every 6 (six) hours as needed by mouth for headache or moderate pain.    . metoprolol succinate (TOPROL-XL) 25 MG 24 hr tablet Take 12.5 mg daily by mouth.     . rosuvastatin (CRESTOR) 10 MG tablet Take 10 mg daily by mouth.     . SYNTHROID 125 MCG tablet Take 125 mcg by mouth daily.  6  . venlafaxine XR (EFFEXOR-XR) 75 MG 24 hr capsule Take 1 capsule (75 mg total) by mouth daily with breakfast. 90 capsule 3  . ZETIA 10 MG tablet Take 10 mg daily by mouth.     . estradiol (VIVELLE-DOT) 0.05 MG/24HR patch Place 1 patch (0.05 mg total) onto the skin 2 (two) times a week. (Patient not taking: Reported on 03/21/2017) 24 patch 4   No current facility-administered medications for this encounter.     Physical Findings: The  patient is in no acute distress. Patient is alert and oriented.  height is 5\' 3"  (1.6 m) and weight is 138 lb 12.8 oz (63 kg). Her oral temperature is 98.4 F (36.9 C). Her blood pressure is 149/104 (abnormal) and her pulse is 78. Her oxygen saturation is 99%. .  Lungs are clear to auscultation bilaterally. Heart has regular rate and rhythm. No palpable cervical, supraclavicular, or axillary adenopathy. Abdomen soft, non-tender, normal bowel sounds. The left breast shows no palpable mass or nipple discharge. The right breast shows a periareolar scar which is healing well without signs of drainage or infection.  Lab Findings: Lab Results  Component Value Date   WBC 5.4 03/28/2017   HGB 14.8 03/28/2017   HCT 43.7 03/28/2017   MCV 89.4 03/28/2017   PLT 121 (L) 03/28/2017    Radiographic Findings: No results found.  Impression:  Intermediate grade ductal carcinoma in situ of the right breast. The patient's initial biopsy for diagnosis did remove the entire lesion. Given the patient's young age and risk for recurrence in the breast without additional local treatment, I would recommend adjuvant radiation therapy for this patient. I discussed course of treatment side effects and potential toxicities of radiation therapy in this situation with the patient and her daughter. She appears to understand and wishes to proceed  with planned course of treatment.  Plan:  Patient will return in early January for simulation and to begin treatment. I anticipate that the patient would be a good candidate for hypofractionated accelerated radiation therapy.  ____________________________________ Gery Pray, MD

## 2017-05-09 ENCOUNTER — Ambulatory Visit
Admission: RE | Admit: 2017-05-09 | Discharge: 2017-05-09 | Disposition: A | Discharge status: Home / Self Care | Payer: BLUE CROSS/BLUE SHIELD | Source: Ambulatory Visit | Attending: Radiation Oncology | Admitting: Radiation Oncology

## 2017-05-09 DIAGNOSIS — Z510 Encounter for antineoplastic radiation therapy: Secondary | ICD-10-CM | POA: Diagnosis not present

## 2017-05-09 DIAGNOSIS — D0511 Intraductal carcinoma in situ of right breast: Secondary | ICD-10-CM

## 2017-05-09 NOTE — Progress Notes (Signed)
  Radiation Oncology         [phone]) [phone] ________________________________  Name: Michelle Fernandez MRN: [id number]  Date: 05/09/2017  DOB: Sep 11, 1960  SIMULATION AND TREATMENT PLANNING NOTE   DIAGNOSIS: 57 y.o. women with  Ductal carcinoma in situ (DCIS) of right breast, intermediate grade.  NARRATIVE:  The patient was brought to the [facility].  Identity was confirmed.  All relevant records and images related to the planned course of therapy were reviewed.  The patient freely provided informed written consent to proceed with treatment after reviewing the details related to the planned course of therapy. The consent form was witnessed and verified by the simulation staff.  Then, the patient was set-up in a stable reproducible  supine position for radiation therapy.  CT images were obtained.  Surface markings were placed.  The CT images were loaded into the planning software.  Then the target and avoidance structures were contoured.  Treatment planning then occurred.  The radiation prescription was entered and confirmed.  Then, I designed and supervised the construction of a total of 3 medically necessary complex treatment devices.  I have requested : 3D Simulation  I have requested a DVH of the following structures: Lumpectomy cavity, heart, lungs.  I have ordered:dose calc.  PLAN:  The patient will receive 40.05 Gy in 15 fractions followed by a boost to the lumpectomy cavity of 10 gray for a cumulative dose of 50.05 gray. ( hypo-fractionated treatment)  -----------------------------------  Blair Promise, PhD, MD  This document serves as a record of services personally performed by Gery Pray MD. It was created on his behalf by Delton Coombes, a trained medical scribe. The creation of this record is based on the scribe's personal observations and the provider's statements to them.

## 2017-05-10 DIAGNOSIS — Z510 Encounter for antineoplastic radiation therapy: Secondary | ICD-10-CM | POA: Insufficient documentation

## 2017-05-10 DIAGNOSIS — D0511 Intraductal carcinoma in situ of right breast: Secondary | ICD-10-CM | POA: Insufficient documentation

## 2017-05-12 DIAGNOSIS — D0511 Intraductal carcinoma in situ of right breast: Secondary | ICD-10-CM | POA: Diagnosis not present

## 2017-05-12 DIAGNOSIS — Z510 Encounter for antineoplastic radiation therapy: Secondary | ICD-10-CM | POA: Diagnosis not present

## 2017-05-16 ENCOUNTER — Ambulatory Visit
Admission: RE | Admit: 2017-05-16 | Discharge: 2017-05-16 | Disposition: A | Discharge status: Home / Self Care | Payer: BLUE CROSS/BLUE SHIELD | Source: Ambulatory Visit | Attending: Radiation Oncology | Admitting: Radiation Oncology

## 2017-05-16 DIAGNOSIS — Z510 Encounter for antineoplastic radiation therapy: Secondary | ICD-10-CM | POA: Diagnosis not present

## 2017-05-16 DIAGNOSIS — D0511 Intraductal carcinoma in situ of right breast: Secondary | ICD-10-CM

## 2017-05-16 NOTE — Progress Notes (Signed)
  Radiation Oncology         [phone] ________________________________  Name: Michelle Fernandez MRN: [id number]  Date: 05/16/2017  DOB: 1960-07-17  Simulation Verification Note    ICD-10-CM   1. Ductal carcinoma in situ (DCIS) of right breast D05.11     Status: outpatient  NARRATIVE: The patient was brought to the treatment unit and placed in the planned treatment position. The clinical setup was verified. Then port films were obtained and uploaded to the radiation oncology medical record software.  The treatment beams were carefully compared against the planned radiation fields. The position location and shape of the radiation fields was reviewed. They targeted volume of tissue appears to be appropriately covered by the radiation beams. Organs at risk appear to be excluded as planned.  Based on my personal review, I approved the simulation verification. The patient's treatment will proceed as planned.  -----------------------------------  Blair Promise, PhD, MD

## 2017-05-17 ENCOUNTER — Ambulatory Visit
Admission: RE | Admit: 2017-05-17 | Discharge: 2017-05-17 | Disposition: A | Discharge status: Home / Self Care | Payer: BLUE CROSS/BLUE SHIELD | Source: Ambulatory Visit | Attending: Radiation Oncology | Admitting: Radiation Oncology

## 2017-05-17 DIAGNOSIS — Z510 Encounter for antineoplastic radiation therapy: Secondary | ICD-10-CM | POA: Insufficient documentation

## 2017-05-17 DIAGNOSIS — D0511 Intraductal carcinoma in situ of right breast: Secondary | ICD-10-CM | POA: Insufficient documentation

## 2017-05-17 MED ORDER — ALRA NON-METALLIC DEODORANT (RAD-ONC)
1.0000 "application " | Freq: Once | TOPICAL | Status: AC
  Administered 2017-05-17: 1 via TOPICAL

## 2017-05-17 MED ORDER — RADIAPLEXRX EX GEL
Freq: Once | CUTANEOUS | Status: AC
  Administered 2017-05-17: 14:00:00 via TOPICAL

## 2017-05-17 NOTE — Progress Notes (Signed)
Pt here for patient teaching.  Pt given Radiation and You booklet, skin care instructions, Alra deodorant and Radiaplex gel.  Reviewed areas of pertinence such as fatigue and skin changes . Pt able to give teach back of to pat skin and use unscented/gentle soap,apply Radiaplex bid, avoid applying anything to skin within 4 hours of treatment and to use an electric razor if they must shave. Pt demonstrated understanding and verbalizes understanding of information given and will contact nursing with any questions or concerns.          

## 2017-05-18 ENCOUNTER — Ambulatory Visit
Admission: RE | Admit: 2017-05-18 | Discharge: 2017-05-18 | Disposition: A | Discharge status: Home / Self Care | Payer: BLUE CROSS/BLUE SHIELD | Source: Ambulatory Visit | Attending: Radiation Oncology | Admitting: Radiation Oncology

## 2017-05-18 DIAGNOSIS — Z510 Encounter for antineoplastic radiation therapy: Secondary | ICD-10-CM | POA: Diagnosis not present

## 2017-05-19 ENCOUNTER — Ambulatory Visit
Admission: RE | Admit: 2017-05-19 | Discharge: 2017-05-19 | Disposition: A | Discharge status: Home / Self Care | Payer: BLUE CROSS/BLUE SHIELD | Source: Ambulatory Visit | Attending: Radiation Oncology | Admitting: Radiation Oncology

## 2017-05-19 DIAGNOSIS — Z510 Encounter for antineoplastic radiation therapy: Secondary | ICD-10-CM | POA: Diagnosis not present

## 2017-05-20 ENCOUNTER — Ambulatory Visit
Admission: RE | Admit: 2017-05-20 | Discharge: 2017-05-20 | Disposition: A | Discharge status: Home / Self Care | Payer: BLUE CROSS/BLUE SHIELD | Source: Ambulatory Visit | Attending: Radiation Oncology | Admitting: Radiation Oncology

## 2017-05-20 DIAGNOSIS — Z510 Encounter for antineoplastic radiation therapy: Secondary | ICD-10-CM | POA: Diagnosis not present

## 2017-05-23 ENCOUNTER — Ambulatory Visit
Admission: RE | Admit: 2017-05-23 | Discharge: 2017-05-23 | Disposition: A | Discharge status: Home / Self Care | Payer: BLUE CROSS/BLUE SHIELD | Source: Ambulatory Visit | Attending: Radiation Oncology | Admitting: Radiation Oncology

## 2017-05-23 DIAGNOSIS — Z510 Encounter for antineoplastic radiation therapy: Secondary | ICD-10-CM | POA: Diagnosis not present

## 2017-05-24 ENCOUNTER — Ambulatory Visit
Admission: RE | Admit: 2017-05-24 | Discharge: 2017-05-24 | Disposition: A | Discharge status: Home / Self Care | Payer: BLUE CROSS/BLUE SHIELD | Source: Ambulatory Visit | Attending: Radiation Oncology | Admitting: Radiation Oncology

## 2017-05-24 DIAGNOSIS — Z510 Encounter for antineoplastic radiation therapy: Secondary | ICD-10-CM | POA: Diagnosis not present

## 2017-05-25 ENCOUNTER — Ambulatory Visit
Admission: RE | Admit: 2017-05-25 | Discharge: 2017-05-25 | Disposition: A | Discharge status: Home / Self Care | Payer: BLUE CROSS/BLUE SHIELD | Source: Ambulatory Visit | Attending: Radiation Oncology | Admitting: Radiation Oncology

## 2017-05-25 DIAGNOSIS — Z510 Encounter for antineoplastic radiation therapy: Secondary | ICD-10-CM | POA: Diagnosis not present

## 2017-05-26 ENCOUNTER — Ambulatory Visit
Admission: RE | Admit: 2017-05-26 | Discharge: 2017-05-26 | Disposition: A | Discharge status: Home / Self Care | Payer: BLUE CROSS/BLUE SHIELD | Source: Ambulatory Visit | Attending: Radiation Oncology | Admitting: Radiation Oncology

## 2017-05-26 DIAGNOSIS — Z510 Encounter for antineoplastic radiation therapy: Secondary | ICD-10-CM | POA: Diagnosis not present

## 2017-05-27 ENCOUNTER — Ambulatory Visit
Admission: RE | Admit: 2017-05-27 | Discharge: 2017-05-27 | Disposition: A | Discharge status: Home / Self Care | Payer: BLUE CROSS/BLUE SHIELD | Source: Ambulatory Visit | Attending: Radiation Oncology | Admitting: Radiation Oncology

## 2017-05-27 DIAGNOSIS — Z510 Encounter for antineoplastic radiation therapy: Secondary | ICD-10-CM | POA: Diagnosis not present

## 2017-05-30 ENCOUNTER — Ambulatory Visit
Admission: RE | Admit: 2017-05-30 | Discharge: 2017-05-30 | Disposition: A | Discharge status: Home / Self Care | Payer: BLUE CROSS/BLUE SHIELD | Source: Ambulatory Visit | Attending: Radiation Oncology | Admitting: Radiation Oncology

## 2017-05-30 DIAGNOSIS — Z510 Encounter for antineoplastic radiation therapy: Secondary | ICD-10-CM | POA: Diagnosis not present

## 2017-05-31 ENCOUNTER — Ambulatory Visit
Admission: RE | Admit: 2017-05-31 | Payer: BLUE CROSS/BLUE SHIELD | Source: Ambulatory Visit | Admitting: Radiation Oncology

## 2017-05-31 ENCOUNTER — Ambulatory Visit
Admission: RE | Admit: 2017-05-31 | Discharge: 2017-05-31 | Disposition: A | Discharge status: Home / Self Care | Payer: BLUE CROSS/BLUE SHIELD | Source: Ambulatory Visit | Attending: Radiation Oncology | Admitting: Radiation Oncology

## 2017-05-31 DIAGNOSIS — Z510 Encounter for antineoplastic radiation therapy: Secondary | ICD-10-CM | POA: Diagnosis not present

## 2017-06-01 ENCOUNTER — Ambulatory Visit
Admission: RE | Admit: 2017-06-01 | Discharge: 2017-06-01 | Disposition: A | Discharge status: Home / Self Care | Payer: BLUE CROSS/BLUE SHIELD | Source: Ambulatory Visit | Attending: Radiation Oncology | Admitting: Radiation Oncology

## 2017-06-01 DIAGNOSIS — Z510 Encounter for antineoplastic radiation therapy: Secondary | ICD-10-CM | POA: Diagnosis not present

## 2017-06-02 ENCOUNTER — Ambulatory Visit
Admission: RE | Admit: 2017-06-02 | Discharge: 2017-06-02 | Disposition: A | Discharge status: Home / Self Care | Payer: BLUE CROSS/BLUE SHIELD | Source: Ambulatory Visit | Attending: Radiation Oncology | Admitting: Radiation Oncology

## 2017-06-02 DIAGNOSIS — Z510 Encounter for antineoplastic radiation therapy: Secondary | ICD-10-CM | POA: Diagnosis not present

## 2017-06-03 ENCOUNTER — Ambulatory Visit
Admission: RE | Admit: 2017-06-03 | Discharge: 2017-06-03 | Disposition: A | Discharge status: Home / Self Care | Payer: BLUE CROSS/BLUE SHIELD | Source: Ambulatory Visit | Attending: Radiation Oncology | Admitting: Radiation Oncology

## 2017-06-03 DIAGNOSIS — Z510 Encounter for antineoplastic radiation therapy: Secondary | ICD-10-CM | POA: Diagnosis not present

## 2017-06-06 ENCOUNTER — Ambulatory Visit
Admission: RE | Admit: 2017-06-06 | Discharge: 2017-06-06 | Disposition: A | Discharge status: Home / Self Care | Payer: BLUE CROSS/BLUE SHIELD | Source: Ambulatory Visit | Attending: Radiation Oncology | Admitting: Radiation Oncology

## 2017-06-06 DIAGNOSIS — Z510 Encounter for antineoplastic radiation therapy: Secondary | ICD-10-CM | POA: Diagnosis not present

## 2017-06-06 DIAGNOSIS — D0511 Intraductal carcinoma in situ of right breast: Secondary | ICD-10-CM

## 2017-06-06 NOTE — Progress Notes (Signed)
Simulation note The patient was brought to the treatment room for simulation for the patient's upcoming electron treatment. The patient was setup in the treatment position and the target region was delineated. The patient will receive treatment to the right breast/ chest wall using an en face electron field. One customized block/complex treatment device has been constructed for this purpose, and this will be used on a daily basis during the patient's treatment. After appropriate set up was confirmed, skin markings were placed to allow accurate targeting of the treatment area during the patient's course of therapy.  ------------------------------------------------

## 2017-06-07 ENCOUNTER — Ambulatory Visit
Admission: RE | Admit: 2017-06-07 | Discharge: 2017-06-07 | Disposition: A | Discharge status: Home / Self Care | Payer: BLUE CROSS/BLUE SHIELD | Source: Ambulatory Visit | Attending: Radiation Oncology | Admitting: Radiation Oncology

## 2017-06-07 DIAGNOSIS — Z510 Encounter for antineoplastic radiation therapy: Secondary | ICD-10-CM | POA: Diagnosis not present

## 2017-06-07 DIAGNOSIS — D0511 Intraductal carcinoma in situ of right breast: Secondary | ICD-10-CM

## 2017-06-07 MED ORDER — SONAFINE EX EMUL
1.0000 "application " | Freq: Once | CUTANEOUS | Status: AC
  Administered 2017-06-07: 1 via TOPICAL

## 2017-06-08 ENCOUNTER — Ambulatory Visit
Admission: RE | Admit: 2017-06-08 | Discharge: 2017-06-08 | Disposition: A | Discharge status: Home / Self Care | Payer: BLUE CROSS/BLUE SHIELD | Source: Ambulatory Visit | Attending: Radiation Oncology | Admitting: Radiation Oncology

## 2017-06-08 DIAGNOSIS — Z510 Encounter for antineoplastic radiation therapy: Secondary | ICD-10-CM | POA: Diagnosis not present

## 2017-06-09 ENCOUNTER — Ambulatory Visit
Admission: RE | Admit: 2017-06-09 | Discharge: 2017-06-09 | Disposition: A | Discharge status: Home / Self Care | Payer: BLUE CROSS/BLUE SHIELD | Source: Ambulatory Visit | Attending: Radiation Oncology | Admitting: Radiation Oncology

## 2017-06-09 DIAGNOSIS — Z510 Encounter for antineoplastic radiation therapy: Secondary | ICD-10-CM | POA: Diagnosis not present

## 2017-06-10 ENCOUNTER — Ambulatory Visit
Admission: RE | Admit: 2017-06-10 | Discharge: 2017-06-10 | Disposition: A | Discharge status: Home / Self Care | Payer: BLUE CROSS/BLUE SHIELD | Source: Ambulatory Visit | Attending: Radiation Oncology | Admitting: Radiation Oncology

## 2017-06-10 DIAGNOSIS — Z510 Encounter for antineoplastic radiation therapy: Secondary | ICD-10-CM | POA: Diagnosis not present

## 2017-06-13 ENCOUNTER — Encounter: Payer: Self-pay | Admitting: Radiation Oncology

## 2017-06-13 ENCOUNTER — Ambulatory Visit
Admission: RE | Admit: 2017-06-13 | Discharge: 2017-06-13 | Disposition: A | Discharge status: Home / Self Care | Payer: BLUE CROSS/BLUE SHIELD | Source: Ambulatory Visit | Attending: Radiation Oncology | Admitting: Radiation Oncology

## 2017-06-13 DIAGNOSIS — Z510 Encounter for antineoplastic radiation therapy: Secondary | ICD-10-CM | POA: Diagnosis not present

## 2017-06-15 ENCOUNTER — Telehealth: Payer: Self-pay | Admitting: *Deleted

## 2017-06-15 MED ORDER — OSELTAMIVIR PHOSPHATE 75 MG PO CAPS: 75.0000 mg | ORAL_CAPSULE | Freq: Every day | ORAL | 0 refills | Status: DC

## 2017-06-15 NOTE — Telephone Encounter (Signed)
Pt called to this RN to state she completed radiation on Monday- she is to start on tamoxifen 07/01/2017.  She baby sits her grandson ( whom she was with last night) who today has been diagnosed with type A flu.  She states she currently has no symptoms.  Pura is calling to inquire about obtaining script for Tamiflu - note she has not seen her primary MD since prior to diagnosis of her breast cancer.  This RN informed her Tamiflu script will be called in - and informed her of use for prophylactic benefit.

## 2017-06-20 NOTE — Progress Notes (Signed)
  Radiation Oncology         [phone]) [phone] ________________________________  Name: Michelle Fernandez MRN: [id number]  Date: 06/13/2017  DOB: 1960/10/01  End of Treatment Note  Diagnosis:   57 y.o. female with ductal carcinoma in situ (DCIS) of right breast,intermediate grade     Indication for treatment:  Curative       Radiation treatment dates:   05/17/2017 - 06/13/2017  Site/dose:    1. Right Breast / 40.05 Gy in 15 fractions 2. Right Breast Boost / 10 Gy in 5 fractions  Beams/energy:    1. 3D / 6X 2. 3D / 12E, 9E   Narrative: The patient tolerated radiation treatment relatively well.  She experienced very mild fatigue and radiation-related skin changes. She reported itching and burning to her skin and had some erythema and hyperpigmentation but no skin breakdown. She is using her Radiaplex gel as directed and was given Sonafine for her itching.  Plan: The patient has completed radiation treatment. The patient will return to radiation oncology clinic for routine followup in one month. I advised them to call or return sooner if they have any questions or concerns related to their recovery or treatment.  -----------------------------------  Blair Promise, PhD, MD  This document serves as a record of services personally performed by Gery Pray, MD. It was created on his behalf by Rae Lips, a trained medical scribe. The creation of this record is based on the scribe's personal observations and the provider's statements to them. This document has been checked and approved by the attending provider.

## 2017-07-05 ENCOUNTER — Ambulatory Visit: Payer: BLUE CROSS/BLUE SHIELD | Admitting: Obstetrics & Gynecology

## 2017-07-05 ENCOUNTER — Other Ambulatory Visit: Payer: Self-pay

## 2017-07-05 ENCOUNTER — Encounter: Payer: Self-pay | Admitting: Obstetrics & Gynecology

## 2017-07-05 VITALS — BP 126/84 | HR 72 | Resp 12 | Ht 63.0 in | Wt 148.0 lb

## 2017-07-05 DIAGNOSIS — Z01419 Encounter for gynecological examination (general) (routine) without abnormal findings: Secondary | ICD-10-CM | POA: Diagnosis not present

## 2017-07-05 MED ORDER — GABAPENTIN 100 MG PO CAPS: ORAL_CAPSULE | ORAL | 0 refills | Status: DC

## 2017-07-05 NOTE — Progress Notes (Signed)
57 y.o. G2P2 MarriedCaucasianF here for annual exam.  Doing well.  Had DCIS treated with a lumpectomy and radiation.  She just finished radiation less than a month ago.  Having a lot of fatigue.  She will then be be on Tamoxifen for 5 years.    Denies vaginal bleeding.    PCP:  Dr. Forde Dandy.  Has appt next month.    Patient's last menstrual period was 05/03/1986.          Sexually active: Yes.    The current method of family planning is status post hysterectomy.    Exercising: No.  The patient does not participate in regular exercise at present. Smoker:  Former smoker  Health Maintenance: Pap:  03/18/15 Neg  History of abnormal Pap:  no MMG:  02/01/17 right bx Breast Cancer  Colonoscopy:  Never.  Has declines.   BMD:   Never TDaP:  unsure Pneumonia vaccine(s):  no Shingrix:   Note done Hep C testing: 03/24/16 neg  Screening Labs:PCP does labs    reports that she quit smoking about 11 years ago. Her smoking use included cigarettes. she has never used smokeless tobacco. She reports that she does not drink alcohol or use drugs.  Past Medical History:  Diagnosis Date  . Breast cancer ([place])   . Endometriosis   . Family history of breast cancer   . Genetic testing 02/10/2017   STAT Breast panel with reflex to Common Cancers panel (47 genes) - No pathogenic mutations detected  . Graves disease   . 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 Hypertension   . Migraine   . Personal history of asthma   . PONV (postoperative nausea and vomiting)    "really sick when I wake up"    Past Surgical History:  Procedure Laterality Date  . ANTERIOR CRUCIATE LIGAMENT REPAIR Left 1991  . BREAST LUMPECTOMY WITH RADIOACTIVE SEED LOCALIZATION Right 04/04/2017   Procedure: RIGHT BREAST LUMPECTOMY WITH RADIOACTIVE SEED LOCALIZATION;  Surgeon: Rolm Bookbinder, MD;  Location: [facility];  Service: General;  Laterality: Right;  . CHOLECYSTECTOMY, LAPAROSCOPIC  2001  . [place]  2001   from lap chole  .  MENISCUS REPAIR Left 1993  . ULNAR TUNNEL RELEASE Right   . VAGINAL HYSTERECTOMY  1988   for endometriosos    Current Outpatient Medications  Medication Sig Dispense Refill  . hydrochlorothiazide (HYDRODIURIL) 25 MG tablet Take 25 mg by mouth daily.    Marland Kitchen ibuprofen (ADVIL,MOTRIN) 200 MG tablet Take 800 mg every 6 (six) hours as needed by mouth for headache or moderate pain.    . metoprolol succinate (TOPROL-XL) 25 MG 24 hr tablet Take 12.5 mg daily by mouth.     . rosuvastatin (CRESTOR) 10 MG tablet Take 10 mg daily by mouth.     . SYNTHROID 125 MCG tablet Take 125 mcg by mouth daily.  6  . venlafaxine XR (EFFEXOR-XR) 75 MG 24 hr capsule Take 1 capsule (75 mg total) by mouth daily with breakfast. 90 capsule 3  . ZETIA 10 MG tablet Take 10 mg daily by mouth.      No current facility-administered medications for this visit.     Family History  Problem Relation Age of Onset  . Breast cancer Mother 105       TAH/BSO 26s; currently 46  . Diabetes Mellitus I Mother   . Lung cancer Mother 6  . Dementia Mother   . Heart attack Father   . Breast cancer Maternal Aunt 108  currently 64s  . Multiple myeloma Sister 96  . Cancer Maternal Uncle        unk. type; deceased 36s  . Cancer Cousin        son of mat uncle with cancer; unk. type; deceased 59s    ROS:  Pertinent items are noted in HPI.  Otherwise, a comprehensive ROS was negative.  Exam:   BP 126/84 (BP Location: Right Arm, Patient Position: Sitting, Cuff Size: Normal)   Pulse 72   Resp 12   Ht 5' 3"  (1.6 m)   Wt 148 lb (67.1 kg)   LMP 05/03/1986   BMI 26.22 kg/m   Height: 5' 3"  (160 cm)  Ht Readings from Last 3 Encounters:  07/05/17 5' 3"  (1.6 m)  04/18/17 5' 3"  (1.6 m)  04/13/17 5' 3"  (1.6 m)    General appearance: alert, cooperative and appears stated age Head: Normocephalic, without obvious abnormality, atraumatic Neck: no adenopathy, supple, symmetrical, trachea midline and thyroid normal to inspection and  palpation Lungs: clear to auscultation bilaterally Breasts: left breast without masses, skin changes, LAD, nipple discharge: right breast with radiation changes and thickening.  no LAD present, Heart: regular rate and rhythm Abdomen: soft, non-tender; bowel sounds normal; no masses,  no organomegaly Extremities: extremities normal, atraumatic, no cyanosis or edema Skin: Skin color, texture, turgor normal. No rashes or lesions Lymph nodes: Cervical, supraclavicular, and axillary nodes normal. No abnormal inguinal nodes palpated Neurologic: Grossly normal   Pelvic: External genitalia:  no lesions              Urethra:  normal appearing urethra with no masses, tenderness or lesions              Bartholins and Skenes: normal                 Vagina: normal appearing vagina with normal color and discharge, no lesions              Cervix: absent              Pap taken: No. Bimanual Exam:  Uterus:  uterus absent              Adnexa: no mass, fullness, tenderness               Rectovaginal: Confirms               Anus:  normal sphincter tone, no lesions  Chaperone was present for exam.  A:  Well Woman with normal exam PMP, no HRT H/o DCIS s/p lumpectomy and radiation and will be on tamoxifen for five years Hypothyroidism Hypertension  P:   Mammogram guidelines reviewed.   pap smear not obtained Lab work will be done with Dr. Forde Dandy next month Cologuard order will be placed for pt return annually or prn

## 2017-07-07 ENCOUNTER — Telehealth: Payer: Self-pay | Admitting: Oncology

## 2017-07-07 NOTE — Telephone Encounter (Signed)
Tried to call regarding vm °

## 2017-07-08 ENCOUNTER — Encounter: Payer: Self-pay | Admitting: Oncology

## 2017-07-11 ENCOUNTER — Encounter: Payer: Self-pay | Admitting: Radiation Oncology

## 2017-07-11 ENCOUNTER — Other Ambulatory Visit: Payer: Self-pay

## 2017-07-11 ENCOUNTER — Ambulatory Visit
Admission: RE | Admit: 2017-07-11 | Discharge: 2017-07-11 | Disposition: A | Discharge status: Home / Self Care | Payer: BLUE CROSS/BLUE SHIELD | Source: Ambulatory Visit | Attending: Radiation Oncology | Admitting: Radiation Oncology

## 2017-07-11 DIAGNOSIS — D0511 Intraductal carcinoma in situ of right breast: Secondary | ICD-10-CM

## 2017-07-11 DIAGNOSIS — Z923 Personal history of irradiation: Secondary | ICD-10-CM | POA: Insufficient documentation

## 2017-07-11 DIAGNOSIS — Z888 Allergy status to other drugs, medicaments and biological substances status: Secondary | ICD-10-CM | POA: Insufficient documentation

## 2017-07-11 DIAGNOSIS — Z08 Encounter for follow-up examination after completed treatment for malignant neoplasm: Secondary | ICD-10-CM | POA: Insufficient documentation

## 2017-07-11 DIAGNOSIS — Z853 Personal history of malignant neoplasm of breast: Secondary | ICD-10-CM | POA: Diagnosis not present

## 2017-07-11 DIAGNOSIS — Z79899 Other long term (current) drug therapy: Secondary | ICD-10-CM | POA: Insufficient documentation

## 2017-07-11 NOTE — Progress Notes (Signed)
Michelle Fernandez is here for follow up after treatment to her right breast.  She denies having any pain.  She said her energy level is getting better.  The skin on her right breast has hyperpigmentation with a small red/open area under her breast.  She is applying baby oil to the area.  BP (!) 159/84 (BP Location: Left Arm, Patient Position: Sitting)   Pulse 71   Temp 97.7 F (36.5 C) (Oral)   Ht 5\' 3"  (1.6 m)   Wt 149 lb 6.4 oz (67.8 kg)   LMP 05/03/1986   SpO2 99%   BMI 26.47 kg/m    Wt Readings from Last 3 Encounters:  07/11/17 149 lb 6.4 oz (67.8 kg)  07/05/17 148 lb (67.1 kg)  04/18/17 138 lb 12.8 oz (63 kg)

## 2017-07-11 NOTE — Progress Notes (Signed)
Radiation Oncology         [phone] ________________________________  Name: Michelle Fernandez MRN: [id number]  Date: 07/11/2017  DOB: [birth year]  Follow-Up Visit Note  CC: Reynold Bowen, MD  Magrinat, Virgie Dad, MD    ICD-10-CM   1. Ductal carcinoma in situ (DCIS) of right breast D05.11     Diagnosis: 57 y.o. female with ductal carcinoma in situ (DCIS) of right breast,intermediate grade     Interval Since Last Radiation:  1 month  05/17/17-06/13/17: 40.05 Gy to the right breast with 10 Gy boost  Narrative:  The patient returns today for routine follow-up. She denies any pain at this time. She reports improving energy levels. She is applying baby oil to a small open, red area under the right breast. She denies coughing or breathing difficulties. She has yet to start Tamoxifen.                              ALLERGIES:  is allergic to phenergan [promethazine hcl].  Meds: Current Outpatient Medications  Medication Sig Dispense Refill  . gabapentin (NEURONTIN) 100 MG capsule Take 1 capsule x 7 nights.  Increase to 2 capsules nightly for a week.  Then increase to 3 capsules. 63 capsule 0  . hydrochlorothiazide (HYDRODIURIL) 25 MG tablet Take 25 mg by mouth daily.    Marland Kitchen ibuprofen (ADVIL,MOTRIN) 200 MG tablet Take 800 mg every 6 (six) hours as needed by mouth for headache or moderate pain.    . metoprolol succinate (TOPROL-XL) 25 MG 24 hr tablet Take 12.5 mg daily by mouth.     . rosuvastatin (CRESTOR) 10 MG tablet Take 10 mg daily by mouth.     . SYNTHROID 125 MCG tablet Take 125 mcg by mouth daily.  6  . venlafaxine XR (EFFEXOR-XR) 75 MG 24 hr capsule Take 1 capsule (75 mg total) by mouth daily with breakfast. 90 capsule 3  . ZETIA 10 MG tablet Take 10 mg daily by mouth.      No current facility-administered medications for this encounter.     Physical Findings: The patient is in no acute distress. Patient is alert and oriented.  height is 5\' 3"  (1.6 m) and weight is 149 lb 6.4  oz (67.8 kg). Her oral temperature is 97.7 F (36.5 C). Her blood pressure is 159/84 (abnormal) and her pulse is 71. Her oxygen saturation is 99%. .    Lungs are clear to auscultation bilaterally. Heart has regular rate and rhythm. No palpable cervical, supraclavicular, or axillary adenopathy. Abdomen soft, non-tender, normal bowel sounds. Right breast mild hyperpigmentation changes, skin has healed well, and no palpable or visible signs of recurrence. Left breast no palpable mass or nipple discharge.    Lab Findings: Lab Results  Component Value Date   WBC 5.4 03/28/2017   HGB 14.8 03/28/2017   HCT 43.7 03/28/2017   MCV 89.4 03/28/2017   PLT 121 (L) 03/28/2017    Radiographic Findings: No results found.  Impression:  The patient is recovering from the effects of radiation. No evidence of recurrence on clinical exam.  Plan:  Follow-up in radiation oncology prn. Follow-up with Dr. Jana Hakim on 08/17/17. Patient will remain under close follow-up with Dr. Jana Hakim as she will be on Tamoxifen. She has not started Tamoxifen yet, but plans on starting it soon.  ____________________________________  This document serves as a record of services personally performed by Gery Pray, MD. It was created on  his behalf by Bethann Humble, a trained medical scribe. The creation of this record is based on the scribe's personal observations and the provider's statements to them. This document has been checked and approved by the attending provider.

## 2017-07-13 ENCOUNTER — Telehealth: Payer: Self-pay | Admitting: *Deleted

## 2017-07-13 NOTE — Telephone Encounter (Signed)
Spoke with patient in regards to cologuard patient report stating empty collection kit received. Patient states she never received the kit and did not send anything back to cologuard. Advised patient would contact cologuard. Address verified.

## 2017-07-13 NOTE — Telephone Encounter (Signed)
Patient updated that new kit would be mailed and would arrive within 3-7 business days. Advised patient to return call to office if she does not receive kit within that time frame. Patient agreeable.   Routing to provider for final review. Patient agreeable to disposition. Will close encounter.

## 2017-07-13 NOTE — Telephone Encounter (Signed)
Spoke with Sharyn Lull at [employer] who confirmed empty kit received. States kit was likely delivered incorrectly and returned to sender. Address and phone number verified and correct on file. Advised would send new kit to patient and would take 3-7 business days to arrive. Advised would update patient.

## 2017-08-01 ENCOUNTER — Other Ambulatory Visit: Payer: Self-pay | Admitting: Obstetrics & Gynecology

## 2017-08-01 NOTE — Telephone Encounter (Signed)
Medication refill request: gabapentin  Last AEX:  07/05/17 SM  Next AEX: 08/22/18  Last MMG (if hormonal medication request): 02/01/17 right bx Breast cancer  Refill authorized: 07/05/17 #[id number], 0RF. Today, please advise.

## 2017-08-05 NOTE — Telephone Encounter (Signed)
Please call pt and check with her regarding the refill request.  Is medication helping and, if so, what dosage is she now using?  I need to adjust rx to correct dosage.  Thanks.

## 2017-08-05 NOTE — Telephone Encounter (Signed)
Call to patient. Patient states that the gabapentin is helping her and she states she is currently taking 200 mg. RN advised would update Dr. Sabra Heck. Patient also states she and Dr. Sabra Heck discussed weaning off her effexor, and she didn't know if Dr. Sabra Heck wanted her to do that now. Patient states she has a lot going on right now, both her mom and sister have cancer, but she will do what Dr. Sabra Heck recommends. RN advised would review with Dr. Sabra Heck and return call to patient. Patient agreeable.

## 2017-08-05 NOTE — Telephone Encounter (Signed)
Rx has been completed.  I would not recommend any changes to her Effexor given the stressors she is experiencing.  Hopefully she will be ok with this as well.

## 2017-08-05 NOTE — Telephone Encounter (Signed)
Call to patient. Message given to patient as seen below from Dr. Sabra Heck. Patient verbalized understanding and appreciative of phone call.   Will close encounter.

## 2017-08-15 NOTE — Progress Notes (Signed)
[place]  [phone] Fax:([phone]) [phone]     ID: SHATON LORE DOB: [birth year]  MR#: [id number]  LYY#:[id number]  Patient Care Team: Reynold Bowen, MD as PCP - General (Endocrinology) Rolm Bookbinder, MD as Consulting Physician (General Surgery) Draxton Luu, Virgie Dad, MD as Consulting Physician (Oncology) Gery Pray, MD as Consulting Physician (Radiation Oncology) OTHER MD:  CHIEF COMPLAINT: Ductal carcinoma in situ, estrogen receptor positive  CURRENT TREATMENT: Tamoxifen   HISTORY OF CURRENT ILLNESS: From the original intake note:  Michelle Fernandez had routine screening mammography at [facility] 01/14/2016 showing an area of grouped calcifications in the central right breast. She was set up for six-month follow-up 07/20/2016 and this showed them to be stable, and probably benign, but repeat 6 month follow-up was recommended and on 01/25/2017 the patient underwent bilateral diagnostic mammography showing the breast density to be category C. In the right breast retroareolar area there was a new group of amorphous calcifications measuring 0.6 cm. Biopsy of this area 02/01/2017 showed ([id number]) ductal carcinoma in situ, intermediate grade, estrogen and progesterone receptor positive.  Right axillary ultrasound 02/01/2017 was sonographically benign  The patient's subsequent history is as detailed below.  INTERVAL HISTORY: Michelle Fernandez returns today for follow-up of her ductal carcinoma in situ. Since her last visit, she completed radiation treatment on 06/13/2017. She gets fatigued earlier in the evenings. She only needs about 5 hours of sleep per night. She used to be able to stay up past 9pm. She has noticed an improvement in sleeping through the night since taking Gabapentin. She is not having intense night sweats any more.  She is ready to begin tamoxifen.   REVIEW OF SYSTEMS:  Michelle Fernandez reports that she enjoys reading. She is taking care of her mom, who has  cancer and demntia now. Michelle Fernandez. She walks occasionally for exercise. She denies unusual headaches, visual changes, nausea, vomiting, or dizziness. There has been no unusual cough, phlegm production, or pleurisy. This been no change in bowel or bladder habits. She denies unexplained fatigue or unexplained weight loss, bleeding, rash, or fever. A detailed review of systems was otherwise stable.    PAST MEDICAL HISTORY: Past Medical History:  Diagnosis Date  . Breast cancer ([place])   . Endometriosis   . Family history of breast cancer   . Genetic testing 02/10/2017   STAT Breast panel with reflex to Common Cancers panel (47 genes) - No pathogenic mutations detected  . Graves disease   . 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 History of radiation therapy 05/17/17-06/13/17   right breast 40.05 Gy in 15 fractions, right breast boost 10 Gy in 5 fractions  . Hypertension   . Migraine   . Personal history of asthma   . PONV (postoperative nausea and vomiting)    "really sick when I wake up"    PAST SURGICAL HISTORY: Past Surgical History:  Procedure Laterality Date  . ANTERIOR CRUCIATE LIGAMENT REPAIR Left 1991  . BREAST LUMPECTOMY WITH RADIOACTIVE SEED LOCALIZATION Right 04/04/2017   Procedure: RIGHT BREAST LUMPECTOMY WITH RADIOACTIVE SEED LOCALIZATION;  Surgeon: Rolm Bookbinder, MD;  Location: [facility];  Service: General;  Laterality: Right;  . CHOLECYSTECTOMY, LAPAROSCOPIC  2001  . [place]  2001   from lap chole  . MENISCUS REPAIR Left 1993  . ULNAR TUNNEL RELEASE Right   . VAGINAL HYSTERECTOMY  1988   for endometriosos    FAMILY HISTORY Family History  Problem Relation Age of Onset  .  Breast cancer Mother 46       TAH/BSO 47s; currently 49  . Diabetes Mellitus I Mother   . Lung cancer Mother 32  . Dementia Mother   . Heart attack Father   . Breast cancer Maternal Aunt 42       currently 69s  . Multiple myeloma Sister 78  . Cancer Maternal  Uncle        unk. type; deceased 69s  . Cancer Cousin        son of mat uncle with cancer; unk. type; deceased 38s  Her father died at age 6 from a MI. Her mother is 68 years old as of October 2018. The patient's mother has a history of lung cancer diagnosed at age 52 and breast cancer diagnosed at age 64. Pt has 3 sisters.  maternal aunt was diagnosed at age 50 with breast cancer.   GYNECOLOGIC HISTORY:  Patient's last menstrual period was 05/03/1986. Menarche: 57 years old Age at first live birth: 57 years old GP: GXP2 LMP: At age 34 due to a partial hysterectomy (no salpingo-oophorectomy). 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 She lives at home with her Husband, Louie Casa, who is a Administrator. She has an outside dog. aughter [employer] lives in [place] and works as a Theme park manager. Daughter Hayward lives in [place] and is a "stay at home mom". The patient has 6 grandchildren, all boys.   ADVANCED DIRECTIVES: Yes, her husband is her healthcare POA.    HEALTH MAINTENANCE: Social History   Tobacco Use  . Smoking status: Former Smoker    Types: Cigarettes    Last attempt to quit: 05/05/2006    Years since quitting: 11.2  . Smokeless tobacco: Never Used  Substance Use Topics  . Alcohol use: No  . Drug use: No     Colonoscopy: No  PAP: Last on 03/18/2015 and negative.  Bone density: No   Allergies  Allergen Reactions  . Phenergan [Promethazine Hcl] Nausea Only    Current Outpatient Medications  Medication Sig Dispense Refill  . gabapentin (NEURONTIN) 100 MG capsule Take 244m nightly 60 capsule 12  . hydrochlorothiazide (HYDRODIURIL) 25 MG tablet Take 25 mg by mouth daily.    .Marland Kitchenibuprofen (ADVIL,MOTRIN) 200 MG tablet Take 800 mg every 6 (six) hours as needed by mouth for headache or moderate pain.    . metoprolol succinate (TOPROL-XL) 25 MG 24 hr tablet Take 12.5 mg daily by mouth.     . rosuvastatin (CRESTOR) 10 MG  tablet Take 10 mg daily by mouth.     . SYNTHROID 125 MCG tablet Take 125 mcg by mouth daily.  6  . venlafaxine XR (EFFEXOR-XR) 75 MG 24 hr capsule Take 1 capsule (75 mg total) by mouth daily with breakfast. 90 capsule 3  . ZETIA 10 MG tablet Take 10 mg daily by mouth.      No current Fernandez-administered medications for this visit.     OBJECTIVE: Middle-aged white woman in no acute distress  Vitals:   08/17/17 1514  BP: (!) 151/83  Pulse: 73  Resp: 18  Temp: 98.3 F (36.8 C)  SpO2: 97%     Body mass index is 26.61 kg/m.   Wt Readings from Last 3 Encounters:  08/17/17 150 lb 3.2 oz (68.1 kg)  07/11/17 149 lb 6.4 oz (67.8 kg)  07/05/17 148 lb (67.1 kg)      ECOG FS:1 - Symptomatic but completely ambulatory  Sclerae unicteric, EOMs intact No cervical or supraclavicular adenopathy Lungs no rales or rhonchi Heart regular rate and rhythm Abd soft, nontender, positive bowel sounds MSK no focal spinal tenderness, no upper extremity lymphedema Neuro: nonfocal, well oriented, appropriate affect Breasts: The right breast is status post lumpectomy and radiation, with no evidence of local recurrence and with an excellent cosmetic result.  Left breast is unremarkable.  Both axillae are benign.  LAB RESULTS:  CMP     Component Value Date/Time   NA 138 03/28/2017 0900   NA 142 02/09/2017 0846   K 3.6 03/28/2017 0900   K 3.3 (L) 02/09/2017 0846   CL 103 03/28/2017 0900   CO2 26 03/28/2017 0900   CO2 27 02/09/2017 0846   GLUCOSE 87 03/28/2017 0900   GLUCOSE 87 02/09/2017 0846   BUN <5 (L) 03/28/2017 0900   BUN 6.8 (L) 02/09/2017 0846   CREATININE 0.61 03/28/2017 0900   CREATININE 0.7 02/09/2017 0846   CALCIUM 9.5 03/28/2017 0900   CALCIUM 10.2 02/09/2017 0846   PROT 7.6 02/09/2017 0846   ALBUMIN 4.3 02/09/2017 0846   AST 21 02/09/2017 0846   ALT 25 02/09/2017 0846   ALKPHOS 58 02/09/2017 0846   BILITOT 0.53 02/09/2017 0846   GFRNONAA >60 03/28/2017 0900   GFRAA >60  03/28/2017 0900    No results found for: TOTALPROTELP, ALBUMINELP, A1GS, A2GS, BETS, BETA2SER, GAMS, MSPIKE, SPEI  No results found for: KPAFRELGTCHN, LAMBDASER, KAPLAMBRATIO  Lab Results  Component Value Date   WBC 5.4 03/28/2017   NEUTROABS 3.5 02/09/2017   HGB 14.8 03/28/2017   HCT 43.7 03/28/2017   MCV 89.4 03/28/2017   PLT 121 (L) 03/28/2017    _0 @    No results found for: LABCA2  No components found for: [id number]  No results for input(s): INR in the last 168 hours.  No results found for: LABCA2  No results found for: WUJ811  No results found for: BJY782  No results found for: NFA213  No results found for: CA2729  No components found for: HGQUANT  No results found for: CEA1 / No results found for: CEA1   No results found for: AFPTUMOR  No results found for: CHROMOGRNA  No results found for: PSA1  No visits with results within 3 Day(s) from this visit.  Latest known visit with results is:  Hospital Outpatient Visit on 03/28/2017  Component Date Value Ref Range Status  . Sodium 03/28/2017 138  135 - 145 mmol/L Final  . Potassium 03/28/2017 3.6  3.5 - 5.1 mmol/L Final  . Chloride 03/28/2017 103  101 - 111 mmol/L Final  . CO2 03/28/2017 26  22 - 32 mmol/L Final  . Glucose, Bld 03/28/2017 87  65 - 99 mg/dL Final  . BUN 03/28/2017 <5* 6 - 20 mg/dL Final  . Creatinine, Ser 03/28/2017 0.61  0.44 - 1.00 mg/dL Final  . Calcium 03/28/2017 9.5  8.9 - 10.3 mg/dL Final  . GFR calc non Af Amer 03/28/2017 >60  >60 mL/min Final  . GFR calc Af Amer 03/28/2017 >60  >60 mL/min Final   Comment: (NOTE) The eGFR has been calculated using the CKD EPI equation. This calculation has not been validated in all clinical situations. eGFR's persistently <60 mL/min signify possible Chronic Kidney Disease.   . Anion gap 03/28/2017 9  5 - 15 Final  . WBC 03/28/2017 5.4  4.0 - 10.5 K/uL Final  . RBC 03/28/2017 4.89  3.87 - 5.11 MIL/uL Final  . Hemoglobin 03/28/2017  14.8  12.0 - 15.0 g/dL Final  . HCT 03/28/2017 43.7  36.0 - 46.0 % Final  . MCV 03/28/2017 89.4  78.0 - 100.0 fL Final  . MCH 03/28/2017 30.3  26.0 - 34.0 pg Final  . MCHC 03/28/2017 33.9  30.0 - 36.0 g/dL Final  . RDW 03/28/2017 12.6  11.5 - 15.5 % Final  . Platelets 03/28/2017 121* 150 - 400 K/uL Final    (this displays the last labs from the last 3 days)  No results found for: TOTALPROTELP, ALBUMINELP, A1GS, A2GS, BETS, BETA2SER, GAMS, MSPIKE, SPEI (this displays SPEP labs)  No results found for: KPAFRELGTCHN, LAMBDASER, KAPLAMBRATIO (kappa/lambda light chains)  No results found for: HGBA, HGBA2QUANT, HGBFQUANT, HGBSQUAN (Hemoglobinopathy evaluation)   No results found for: LDH  No results found for: IRON, TIBC, IRONPCTSAT (Iron and TIBC)  No results found for: FERRITIN  Urinalysis    Component Value Date/Time   BILIRUBINUR N 03/24/2016 0917   PROTEINUR N 03/24/2016 0917   UROBILINOGEN negative 03/24/2016 0917   NITRITE N 03/24/2016 0917   LEUKOCYTESUR Negative 03/24/2016 0917     STUDIES: No results found.   ELIGIBLE FOR AVAILABLE RESEARCH PROTOCOL: COMET, declined  ASSESSMENT: 57 y.o. [place] woman Status post right breast retroareolar biopsy 02/01/2017 for ductal carcinoma in situ grade 2, estrogen and progesterone receptor positive  (1) genetics testing through the STAT Breast panel with reflex to  [facility]Common Cancers panel (47 genes) -found no pathogenic mutations in APC, ATM, AXIN2, BARD1, BMPR1A, BRCA1, BRCA2, BRIP1, CDH1, CDK4, CDKN2A, CHEK2, CTNNA1, DICER1, EPCAM, GREM1, HOXB13, KIT, MEN1, MLH1, MSH2, MSH3, MSH6, MUTYH, NBN, NF1, NTHL1, PALB2, PDGFRA, PMS2, POLD1, POLE, PTEN, RAD50, RAD51C, RAD51D, SDHA, SDHB, SDHC, SDHD, SMAD4, SMARCA4, STK11, TP53, TSC1, TSC2, VHL.   (2) post right lumpectomy 04/04/2017 showing only residual atypical ductal hyperplasia  (3) adjuvant radiation 05/17/17-06/13/17: 40.05 Gy to the right breast with 10 Gy boost  (4)  tamoxifen started 08/17/2017.  (5) intermittent tobacco abuse: Has definitively quit smoking as of September 2018  PLAN: Michelle Fernandez did remarkably well with her radiation treatments and is now finally ready to start antiestrogens.  The main reason for her to start antiestrogens is prevention of a new breast cancer developing.  Tamoxifen will cut that risk in half.  However it will also cut in half her risk of local recurrence.  That risk however is very small.  The point of what I have just said is that she does not absolutely have to take tamoxifen.  She is taking it primarily for prophylaxis.  Therefore if she does not tolerate well we will discontinue it  We did review the possible toxicity side effects and complications again in great detail.  She is agreeable to starting I have placed the prescription.  She will return to see me in about 3 months.  If he tolerates tamoxifen well we will continue that for a total of 5 years  She knows to call for any other issues that may develop before the next visit.  Skarlett Sedlacek, Virgie Dad, MD  08/17/17 3:34 PM Medical Oncology and Hematology [facility] [address] [place], [place] [address] Tel. [phone]    Fax. [phone]  This document serves as a record of services personally performed by Lurline Del, MD. It was created on his behalf by Sheron Nightingale, a trained medical scribe. The creation of this record is based on the scribe's personal observations and the provider's statements to them.   I have reviewed the  above documentation for accuracy and completeness, and I agree with the above.

## 2017-08-17 ENCOUNTER — Inpatient Hospital Stay: Payer: BLUE CROSS/BLUE SHIELD | Attending: Oncology | Admitting: Oncology

## 2017-08-17 ENCOUNTER — Telehealth: Payer: Self-pay | Admitting: Oncology

## 2017-08-17 VITALS — BP 151/83 | HR 73 | Temp 98.3°F | Resp 18 | Ht 63.0 in | Wt 150.2 lb

## 2017-08-17 DIAGNOSIS — D0511 Intraductal carcinoma in situ of right breast: Secondary | ICD-10-CM | POA: Insufficient documentation

## 2017-08-17 DIAGNOSIS — Z170 Estrogen receptor positive status [ER+]: Secondary | ICD-10-CM | POA: Diagnosis not present

## 2017-08-17 MED ORDER — TAMOXIFEN CITRATE 20 MG PO TABS: 20.0000 mg | ORAL_TABLET | Freq: Every day | ORAL | 12 refills | Status: AC

## 2017-08-17 NOTE — Telephone Encounter (Signed)
Patient decline avs and calendar °

## 2017-09-27 ENCOUNTER — Telehealth: Payer: Self-pay | Admitting: *Deleted

## 2017-09-27 NOTE — Telephone Encounter (Signed)
Call to patient in regards to patient completing cologuard. RN advised received update from [employer] that patient had not completed cologuard. Patient states she is still interested in completing cologuard, but has "just been really busy." RN advised would update Dr. Sabra Heck. Patient agreeable.   Routing to provider for final review. Patient agreeable to disposition. Will close encounter.

## 2017-10-22 ENCOUNTER — Other Ambulatory Visit: Payer: Self-pay | Admitting: Obstetrics & Gynecology

## 2017-11-06 ENCOUNTER — Other Ambulatory Visit: Payer: Self-pay | Admitting: Oncology

## 2017-11-06 DIAGNOSIS — D0511 Intraductal carcinoma in situ of right breast: Secondary | ICD-10-CM

## 2017-11-17 ENCOUNTER — Inpatient Hospital Stay: Payer: BLUE CROSS/BLUE SHIELD | Admitting: Oncology

## 2017-11-17 ENCOUNTER — Telehealth: Payer: Self-pay | Admitting: Adult Health

## 2017-11-17 NOTE — Telephone Encounter (Signed)
Patient called to reschedule  °

## 2017-11-29 ENCOUNTER — Telehealth: Payer: Self-pay | Admitting: Adult Health

## 2017-11-29 ENCOUNTER — Encounter: Payer: Self-pay | Admitting: Adult Health

## 2017-11-29 ENCOUNTER — Inpatient Hospital Stay: Payer: BLUE CROSS/BLUE SHIELD | Attending: Oncology | Admitting: Adult Health

## 2017-11-29 VITALS — BP 152/87 | HR 78 | Temp 98.1°F | Resp 18 | Ht 63.0 in | Wt 160.4 lb

## 2017-11-29 DIAGNOSIS — Z923 Personal history of irradiation: Secondary | ICD-10-CM

## 2017-11-29 DIAGNOSIS — Z87891 Personal history of nicotine dependence: Secondary | ICD-10-CM

## 2017-11-29 DIAGNOSIS — D0590 Unspecified type of carcinoma in situ of unspecified breast: Secondary | ICD-10-CM | POA: Diagnosis not present

## 2017-11-29 DIAGNOSIS — Z170 Estrogen receptor positive status [ER+]: Secondary | ICD-10-CM | POA: Diagnosis not present

## 2017-11-29 DIAGNOSIS — D0510 Intraductal carcinoma in situ of unspecified breast: Secondary | ICD-10-CM

## 2017-11-29 DIAGNOSIS — D0511 Intraductal carcinoma in situ of right breast: Secondary | ICD-10-CM

## 2017-11-29 DIAGNOSIS — Z79810 Long term (current) use of selective estrogen receptor modulators (SERMs): Secondary | ICD-10-CM

## 2017-11-29 NOTE — Telephone Encounter (Signed)
Per 7/30 los.  Gave patient avs and calendar.

## 2017-11-29 NOTE — Progress Notes (Signed)
[place]  Telephone:([phone] Fax:(336) [phone]     ID: JUNITA KUBOTA DOB: 1960-02-09  MR#: [id number]  JAS#:[id number]  Patient Care Team: Reynold Bowen, MD as PCP - General (Endocrinology) Rolm Bookbinder, MD as Consulting Physician (General Surgery) Magrinat, Virgie Dad, MD as Consulting Physician (Oncology) Gery Pray, MD as Consulting Physician (Radiation Oncology) OTHER MD:  CHIEF COMPLAINT: Ductal carcinoma in situ, estrogen receptor positive  CURRENT TREATMENT: Tamoxifen   HISTORY OF CURRENT ILLNESS: From the original intake note:  ROSELINDA BAHENA had routine screening mammography at [facility] 01/14/2016 showing an area of grouped calcifications in the central right breast. She was set up for six-month follow-up 07/20/2016 and this showed them to be stable, and probably benign, but repeat 6 month follow-up was recommended and on 01/25/2017 the patient underwent bilateral diagnostic mammography showing the breast density to be category C. In the right breast retroareolar area there was a new group of amorphous calcifications measuring 0.6 cm. Biopsy of this area 02/01/2017 showed ([id number]) ductal carcinoma in situ, intermediate grade, estrogen and progesterone receptor positive.  Right axillary ultrasound 02/01/2017 was sonographically benign  The patient's subsequent history is as detailed below.  INTERVAL HISTORY: Lorieann returns today for follow-up of her ductal carcinoma in situ. She is doing well.  Since her last visit she started Tamoxifen.  She has noted hot flashes.  These occur about 10-20 per day.  She is taking Effexor and Gabapentin to help alleviate them.  She notes that hot flashes were also present prior to starting Tamoxifen.    REVIEW OF SYSTEMS:  Idalia is doing well otherwise.  She has no heavy menses, or vaginal bleeding, she denies arthralgias.  She has noted an increase in vaginal discharge.  She has upcoming appointment with Dr.  Sabra Heck in gynecology.    Shene recently underwent hernia surgery 2 weeks ago.  She has not yet been able to return to activity.  She was previously walking.  Otherwise Donesha is doing well today. A detailed ROS was conducted and was otherwise negative.    PAST MEDICAL HISTORY: Past Medical History:  Diagnosis Date  . Breast cancer ([place])   . Endometriosis   . Family history of breast cancer   . Genetic testing 02/10/2017   STAT Breast panel with reflex to Common Cancers panel (47 genes) - No pathogenic mutations detected  . Graves disease   . 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 History of radiation therapy 05/17/17-06/13/17   right breast 40.05 Gy in 15 fractions, right breast boost 10 Gy in 5 fractions  . Hypertension   . Migraine   . Personal history of asthma   . PONV (postoperative nausea and vomiting)    "really sick when I wake up"    PAST SURGICAL HISTORY: Past Surgical History:  Procedure Laterality Date  . ANTERIOR CRUCIATE LIGAMENT REPAIR Left 1991  . BREAST LUMPECTOMY WITH RADIOACTIVE SEED LOCALIZATION Right 04/04/2017   Procedure: RIGHT BREAST LUMPECTOMY WITH RADIOACTIVE SEED LOCALIZATION;  Surgeon: Rolm Bookbinder, MD;  Location: [facility];  Service: General;  Laterality: Right;  . CHOLECYSTECTOMY, LAPAROSCOPIC  2001  . [place]  2001   from lap chole  . MENISCUS REPAIR Left 1993  . ULNAR TUNNEL RELEASE Right   . VAGINAL HYSTERECTOMY  1988   for endometriosos    FAMILY HISTORY Family History  Problem Relation Age of Onset  . Breast cancer Mother 53       TAH/BSO 30s; currently 51  .  Diabetes Mellitus I Mother   . Lung cancer Mother 46  . Dementia Mother   . Heart attack Father   . Breast cancer Maternal Aunt 45       currently 10s  . Multiple myeloma Sister 53  . Cancer Maternal Uncle        unk. type; deceased 60s  . Cancer Cousin        son of mat uncle with cancer; unk. type; deceased 19s  Her father died at age 3 from a MI. Her mother is 53 years  old as of October 2018. The patient's mother has a history of lung cancer diagnosed at age 86 and breast cancer diagnosed at age 60. Pt has 3 sisters.  maternal aunt was diagnosed at age 67 with breast cancer.   GYNECOLOGIC HISTORY:  Patient's last menstrual period was 05/03/1986. Menarche: 57 years old Age at first live birth: 57 years old GP: GXP2 LMP: At age 72 due to a partial hysterectomy (no salpingo-oophorectomy). 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 She lives at home with her Husband, Louie Casa, who is a Administrator. She has an outside dog. aughter [employer] lives in [place] and works as a Theme park manager. Daughter Shiprock lives in [place] and is a "stay at home mom". The patient has 6 grandchildren, all boys.   ADVANCED DIRECTIVES: Yes, her husband is her healthcare POA.    HEALTH MAINTENANCE: Social History   Tobacco Use  . Smoking status: Former Smoker    Types: Cigarettes    Last attempt to quit: 05/05/2006    Years since quitting: 11.5  . Smokeless tobacco: Never Used  Substance Use Topics  . Alcohol use: No  . Drug use: No     Colonoscopy: No  PAP: Last on 03/18/2015 and negative.  Bone density: No   Allergies  Allergen Reactions  . Phenergan [Promethazine Hcl] Nausea Only    Current Outpatient Medications  Medication Sig Dispense Refill  . gabapentin (NEURONTIN) 100 MG capsule Take 262m nightly 60 capsule 12  . hydrochlorothiazide (HYDRODIURIL) 25 MG tablet Take 25 mg by mouth daily.    .Marland Kitchenibuprofen (ADVIL,MOTRIN) 200 MG tablet Take 800 mg every 6 (six) hours as needed by mouth for headache or moderate pain.    . metoprolol succinate (TOPROL-XL) 25 MG 24 hr tablet Take 12.5 mg daily by mouth.     . rosuvastatin (CRESTOR) 10 MG tablet Take 10 mg daily by mouth.     . SYNTHROID 125 MCG tablet Take 125 mcg by mouth daily.  6  . venlafaxine XR (EFFEXOR-XR) 75 MG 24 hr capsule TAKE 1 CAPSULE DAILY WITH  BREAKFAST 90 capsule 3  . ZETIA 10 MG tablet Take 10 mg daily by mouth.      No current facility-administered medications for this visit.     OBJECTIVE:   Vitals:   11/29/17 0935  BP: (!) 152/87  Pulse: 78  Resp: 18  Temp: 98.1 F (36.7 C)  SpO2: 95%     Body mass index is 28.41 kg/m.   Wt Readings from Last 3 Encounters:  11/29/17 160 lb 6.4 oz (72.8 kg)  08/17/17 150 lb 3.2 oz (68.1 kg)  07/11/17 149 lb 6.4 oz (67.8 kg)      ECOG FS:1 - Symptomatic but completely ambulatory GENERAL: Patient is a well appearing female in no acute distress HEENT:  Sclerae anicteric.  Oropharynx clear and moist. No ulcerations or evidence of  oropharyngeal candidiasis. Neck is supple.  NODES:  No cervical, supraclavicular, or axillary lymphadenopathy palpated.  BREAST EXAM:  Right breast s/p lumpectomy and radiation, mild amt of scar tissue present, no nodules or masses noted, left breast without nodules, masses skin or nipple changes, benign bilateral breast exam LUNGS:  Clear to auscultation bilaterally.  No wheezes or rhonchi. HEART:  Regular rate and rhythm. No murmur appreciated. ABDOMEN:  Soft, nontender.  Positive, normoactive bowel sounds. No organomegaly palpated. MSK:  No focal spinal tenderness to palpation. Full range of motion bilaterally in the upper extremities. EXTREMITIES:  No peripheral edema.   SKIN:  Clear with no obvious rashes or skin changes. No nail dyscrasia. NEURO:  Nonfocal. Well oriented.  Appropriate affect.    LAB RESULTS:  CMP     Component Value Date/Time   NA 138 03/28/2017 0900   NA 142 02/09/2017 0846   K 3.6 03/28/2017 0900   K 3.3 (L) 02/09/2017 0846   CL 103 03/28/2017 0900   CO2 26 03/28/2017 0900   CO2 27 02/09/2017 0846   GLUCOSE 87 03/28/2017 0900   GLUCOSE 87 02/09/2017 0846   BUN <5 (L) 03/28/2017 0900   BUN 6.8 (L) 02/09/2017 0846   CREATININE 0.61 03/28/2017 0900   CREATININE 0.7 02/09/2017 0846   CALCIUM 9.5 03/28/2017 0900    CALCIUM 10.2 02/09/2017 0846   PROT 7.6 02/09/2017 0846   ALBUMIN 4.3 02/09/2017 0846   AST 21 02/09/2017 0846   ALT 25 02/09/2017 0846   ALKPHOS 58 02/09/2017 0846   BILITOT 0.53 02/09/2017 0846   GFRNONAA >60 03/28/2017 0900   GFRAA >60 03/28/2017 0900    No results found for: TOTALPROTELP, ALBUMINELP, A1GS, A2GS, BETS, BETA2SER, GAMS, MSPIKE, SPEI  No results found for: KPAFRELGTCHN, LAMBDASER, KAPLAMBRATIO  Lab Results  Component Value Date   WBC 5.4 03/28/2017   NEUTROABS 3.5 02/09/2017   HGB 14.8 03/28/2017   HCT 43.7 03/28/2017   MCV 89.4 03/28/2017   PLT 121 (L) 03/28/2017    _0 @    No results found for: LABCA2  No components found for: [id number]  No results for input(s): INR in the last 168 hours.  No results found for: LABCA2  No results found for: WGY659  No results found for: DJT701  No results found for: XBL390  No results found for: CA2729  No components found for: HGQUANT  No results found for: CEA1 / No results found for: CEA1   No results found for: AFPTUMOR  No results found for: CHROMOGRNA  No results found for: PSA1  No visits with results within 3 Day(s) from this visit.  Latest known visit with results is:  Hospital Outpatient Visit on 03/28/2017  Component Date Value Ref Range Status  . Sodium 03/28/2017 138  135 - 145 mmol/L Final  . Potassium 03/28/2017 3.6  3.5 - 5.1 mmol/L Final  . Chloride 03/28/2017 103  101 - 111 mmol/L Final  . CO2 03/28/2017 26  22 - 32 mmol/L Final  . Glucose, Bld 03/28/2017 87  65 - 99 mg/dL Final  . BUN 03/28/2017 <5* 6 - 20 mg/dL Final  . Creatinine, Ser 03/28/2017 0.61  0.44 - 1.00 mg/dL Final  . Calcium 03/28/2017 9.5  8.9 - 10.3 mg/dL Final  . GFR calc non Af Amer 03/28/2017 >60  >60 mL/min Final  . GFR calc Af Amer 03/28/2017 >60  >60 mL/min Final   Comment: (NOTE) The eGFR has been calculated using the CKD EPI equation. This  calculation has not been validated in all clinical  situations. eGFR's persistently <60 mL/min signify possible Chronic Kidney Disease.   . Anion gap 03/28/2017 9  5 - 15 Final  . WBC 03/28/2017 5.4  4.0 - 10.5 K/uL Final  . RBC 03/28/2017 4.89  3.87 - 5.11 MIL/uL Final  . Hemoglobin 03/28/2017 14.8  12.0 - 15.0 g/dL Final  . HCT 03/28/2017 43.7  36.0 - 46.0 % Final  . MCV 03/28/2017 89.4  78.0 - 100.0 fL Final  . MCH 03/28/2017 30.3  26.0 - 34.0 pg Final  . MCHC 03/28/2017 33.9  30.0 - 36.0 g/dL Final  . RDW 03/28/2017 12.6  11.5 - 15.5 % Final  . Platelets 03/28/2017 121* 150 - 400 K/uL Final    (this displays the last labs from the last 3 days)  No results found for: TOTALPROTELP, ALBUMINELP, A1GS, A2GS, BETS, BETA2SER, GAMS, MSPIKE, SPEI (this displays SPEP labs)  No results found for: KPAFRELGTCHN, LAMBDASER, KAPLAMBRATIO (kappa/lambda light chains)  No results found for: HGBA, HGBA2QUANT, HGBFQUANT, HGBSQUAN (Hemoglobinopathy evaluation)   No results found for: LDH  No results found for: IRON, TIBC, IRONPCTSAT (Iron and TIBC)  No results found for: FERRITIN  Urinalysis    Component Value Date/Time   BILIRUBINUR N 03/24/2016 0917   PROTEINUR N 03/24/2016 0917   UROBILINOGEN negative 03/24/2016 0917   NITRITE N 03/24/2016 0917   LEUKOCYTESUR Negative 03/24/2016 0917     STUDIES: No results found.   ELIGIBLE FOR AVAILABLE RESEARCH PROTOCOL: COMET, declined  ASSESSMENT: 57 y.o. [place] woman Status post right breast retroareolar biopsy 02/01/2017 for ductal carcinoma in situ grade 2, estrogen and progesterone receptor positive  (1) genetics testing through the STAT Breast panel with reflex to  [facility]'sCommon Cancers panel (47 genes) -found no pathogenic mutations in APC, ATM, AXIN2, BARD1, BMPR1A, BRCA1, BRCA2, BRIP1, CDH1, CDK4, CDKN2A, CHEK2, CTNNA1, DICER1, EPCAM, GREM1, HOXB13, KIT, MEN1, MLH1, MSH2, MSH3, MSH6, MUTYH, NBN, NF1, NTHL1, PALB2, PDGFRA, PMS2, POLD1, POLE, PTEN, RAD50, RAD51C, RAD51D, SDHA,  SDHB, SDHC, SDHD, SMAD4, SMARCA4, STK11, TP53, TSC1, TSC2, VHL.   (2) post right lumpectomy 04/04/2017 showing only residual atypical ductal hyperplasia  (3) adjuvant radiation 05/17/17-06/13/17: 40.05 Gy to the right breast with 10 Gy boost  (4) tamoxifen started 08/17/2017.  (5) intermittent tobacco abuse: Has definitively quit smoking as of September 2018.  She says she has a 5 pack year cumulative tobacco history. (10 years all together with 1/2ppd)  PLAN: Veola is doing well today.  She has no clinical sign of recurrence.  She is tolerating the Tamoxifen well and will continue this.  She will be due for mammogram in 01/2018 and I have placed orders for this today at [facility].    Mayanna is having hot flashes.  She is taking Gabapentin and Effexor and will continue this. We reviewed other things that may precipitate hot flashes, such as caffeine, hot beverages, tobacco, ETOH, and spicy foods.  We reviewed that acupuncture may help alleviate hot flashes, and I gave her information of an acupuncturist in town.  She is going to consider it.    She will continue to f/u with GYN and knows if her vaginal discharge becomes an issue to see them for evaluation.    Breshay and I reviewed healthy living, including healthy diet, exercise 30 minutes most days of the week, avoiding tobacco, limiting ETOH, and keeping up with primary care.    Agness will return in 6 months for labs and f/u with Dr.  Magrinat.  She knows to call for any questions or concerns in the interim.    A total of (30) minutes of face-to-face time was spent with this patient with greater than 50% of that time in counseling and care-coordination.  Wilber Bihari, NP 11/29/17 9:43 AM Medical Oncology and Hematology [facility] [address] [place],  [address] Tel. [phone]    Fax. [phone]

## 2018-02-03 ENCOUNTER — Other Ambulatory Visit: Payer: Self-pay | Admitting: Oncology

## 2018-02-03 DIAGNOSIS — D0511 Intraductal carcinoma in situ of right breast: Secondary | ICD-10-CM

## 2018-03-17 ENCOUNTER — Other Ambulatory Visit: Payer: Self-pay

## 2018-03-17 ENCOUNTER — Encounter: Payer: Self-pay | Admitting: Obstetrics & Gynecology

## 2018-03-17 ENCOUNTER — Ambulatory Visit: Payer: No Typology Code available for payment source | Admitting: Obstetrics & Gynecology

## 2018-03-17 ENCOUNTER — Telehealth: Payer: Self-pay | Admitting: Obstetrics & Gynecology

## 2018-03-17 VITALS — BP 156/90 | HR 90 | Resp 16 | Ht 63.0 in | Wt 166.0 lb

## 2018-03-17 DIAGNOSIS — N898 Other specified noninflammatory disorders of vagina: Secondary | ICD-10-CM

## 2018-03-17 DIAGNOSIS — R350 Frequency of micturition: Secondary | ICD-10-CM | POA: Diagnosis not present

## 2018-03-17 LAB — POCT URINALYSIS DIPSTICK
BILIRUBIN UA: NEGATIVE
GLUCOSE UA: NEGATIVE
KETONES UA: NEGATIVE
Leukocytes, UA: NEGATIVE
Nitrite, UA: NEGATIVE
Protein, UA: NEGATIVE
RBC UA: NEGATIVE
Urobilinogen, UA: 0.2 E.U./dL
pH, UA: 7 (ref 5.0–8.0)

## 2018-03-17 MED ORDER — TERCONAZOLE 0.4 % VA CREA: 1.0000 | TOPICAL_CREAM | Freq: Every day | VAGINAL | 0 refills | Status: DC

## 2018-03-17 MED ORDER — FLUCONAZOLE 150 MG PO TABS: ORAL_TABLET | ORAL | 0 refills | Status: DC

## 2018-03-17 NOTE — Telephone Encounter (Signed)
Spoke with patient. Patient reports external vaginal itching and vaginal d/c, started 3 wks ago. Vaginal d/c has no color or odor. Denies pelvic pain or vaginal bleeding. HAs tried vagisil with no relief.  Patient has Hx of DCIS right breast, on tamoxifen. Started potassium 3 months ago, doubled dose 3 wks ago. Patient states she was unsure if she should f/u with ONC or GYN. Advised patient she can f/u with ONC first if she chooses and return call if OV still needed with GYN. Would need OV for further evaluation. Patient request to go ahead and schedule OV with Dr. Sabra Heck.   OV scheduled for today at 2:45pm with Dr. Sabra Heck.   Routing to provider for final review. Patient is agreeable to disposition. Will close encounter.

## 2018-03-17 NOTE — Progress Notes (Signed)
GYNECOLOGY  VISIT  CC:   Vaginal irritation  HPI: 57 y.o. G2P2 Married White or Caucasian female here for vaginitis symptoms that has been present for about three weeks.  She's had discharge for about 3 weeks.  She tried OTC monistat without improvement.  Denies urinary symptoms.  Denies pelvic pain.  Denies low back pain.   GYNECOLOGIC HISTORY: Patient's last menstrual period was 05/03/1986. Contraception:  Hysterectomy  Menopausal hormone therapy: none   Patient Active Problem List   Diagnosis Date Noted  . Genetic testing 02/10/2017  . Breast cancer ([place])   . Family history of breast cancer   . Ductal carcinoma in situ (DCIS) of right breast 02/03/2017  . Cervical dystonia 05/05/2016  . H/O total vaginal hysterectomy, endometriosis 1988 08/02/2012  . GERD (gastroesophageal reflux disease) 08/02/2012  . 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 Hypertension     Past Medical History:  Diagnosis Date  . Breast cancer ([place])   . Endometriosis   . Family history of breast cancer   . Genetic testing 02/10/2017   STAT Breast panel with reflex to Common Cancers panel (47 genes) - No pathogenic mutations detected  . Graves disease   . 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 History of radiation therapy 05/17/17-06/13/17   right breast 40.05 Gy in 15 fractions, right breast boost 10 Gy in 5 fractions  . Hypertension   . Migraine   . Personal history of asthma   . PONV (postoperative nausea and vomiting)    "really sick when I wake up"    Past Surgical History:  Procedure Laterality Date  . ANTERIOR CRUCIATE LIGAMENT REPAIR Left 1991  . BREAST LUMPECTOMY WITH RADIOACTIVE SEED LOCALIZATION Right 04/04/2017   Procedure: RIGHT BREAST LUMPECTOMY WITH RADIOACTIVE SEED LOCALIZATION;  Surgeon: Rolm Bookbinder, MD;  Location: [facility];  Service: General;  Laterality: Right;  . CHOLECYSTECTOMY, LAPAROSCOPIC  2001  . [place]  2001   from lap chole  . MENISCUS REPAIR Left 1993  . ULNAR TUNNEL  RELEASE Right   . VAGINAL HYSTERECTOMY  1988   for endometriosos    MEDS:   Current Outpatient Medications on File Prior to Visit  Medication Sig Dispense Refill  . gabapentin (NEURONTIN) 100 MG capsule Take 268m nightly 60 capsule 12  . hydrochlorothiazide (HYDRODIURIL) 25 MG tablet Take 25 mg by mouth daily.    .Marland KitchenKLOR-CON M20 20 MEQ tablet Take 20 mEq by mouth daily.  5  . metoprolol succinate (TOPROL-XL) 25 MG 24 hr tablet Take 12.5 mg daily by mouth.     . rosuvastatin (CRESTOR) 10 MG tablet Take 10 mg daily by mouth.     . SYNTHROID 125 MCG tablet Take 125 mcg by mouth daily.  6  . venlafaxine XR (EFFEXOR-XR) 75 MG 24 hr capsule TAKE 1 CAPSULE DAILY WITH BREAKFAST 90 capsule 3  . ZETIA 10 MG tablet Take 10 mg daily by mouth.     . tamoxifen (NOLVADEX) 20 MG tablet Take 20 mg by mouth daily.     No current facility-administered medications on file prior to visit.     ALLERGIES: Phenergan [promethazine hcl]  Family History  Problem Relation Age of Onset  . Breast cancer Mother 581      TAH/BSO 429s currently 875 . Diabetes Mellitus I Mother   . Lung cancer Mother 872 . Dementia Mother   . Heart attack Father   . Breast cancer Maternal Aunt 56       currently  36s  . Multiple myeloma Sister 45  . Cancer Maternal Uncle        unk. type; deceased 10s  . Cancer Cousin        son of mat uncle with cancer; unk. type; deceased 65s    SH:  Married, non smoker  Review of Systems  Genitourinary: Positive for frequency and vaginal discharge.       Vulvar/ vaginal itching  All other systems reviewed and are negative.   PHYSICAL EXAMINATION:    BP (!) 156/90 (BP Location: Right Arm, Patient Position: Sitting, Cuff Size: Large)   Pulse 90   Resp 16   Ht 5' 3"  (1.6 m)   Wt 166 lb (75.3 kg)   LMP 05/03/1986   BMI 29.41 kg/m     General appearance: alert, cooperative and appears stated age Lymph:  no inguinal LAD noted  Pelvic: External genitalia:  no lesions               Urethra:  normal appearing urethra with no masses, tenderness or lesions              Bartholins and Skenes: normal                 Vagina: normal appearing vagina with normal color and discharge, no lesions              Cervix: absent              Bimanual Exam:  Uterus:  uterus absent              Adnexa: no mass, fullness, tenderness  Chaperone was present for exam.  Assessment: Vaginal irritation  Plan: Affirm pending Terazol 7 nightly x 7 nights and diflcuan 112m po x 1, repeat 72 hours.  #2/0RF.

## 2018-03-17 NOTE — Telephone Encounter (Signed)
Patient stated that she believes she has a yeast infection. Patient is on Tamoxifen (but is currently taking a week long break due to nausea). Patient started taking Potassium about 3 months ago and have doubled dosage within the past 3 weeks. Patient stated that symptoms started when they doubled the dosage. Patient is currently experiencing discharge (which she was told could be a side effect of the Tamoxifen), and "a lot of itching."

## 2018-03-18 LAB — VAGINITIS/VAGINOSIS, DNA PROBE
Candida Species: POSITIVE — AB
Gardnerella vaginalis: NEGATIVE
TRICHOMONAS VAG: NEGATIVE

## 2018-03-20 ENCOUNTER — Encounter: Payer: Self-pay | Admitting: Obstetrics & Gynecology

## 2018-03-21 ENCOUNTER — Telehealth: Payer: Self-pay | Admitting: *Deleted

## 2018-03-21 NOTE — Telephone Encounter (Signed)
-----   Message from Megan Salon, MD sent at 03/20/2018  9:48 PM EST ----- Please let pt know her vaginitis testing showed yeast.  She was treated for this at [facility].  Can you please get an update from her and see how her symptoms now are?  Thanks.

## 2018-03-21 NOTE — Telephone Encounter (Signed)
Results given by Glorianne Manchester, RN

## 2018-03-21 NOTE — Telephone Encounter (Signed)
LM for pt to call back.

## 2018-03-23 ENCOUNTER — Telehealth: Payer: Self-pay | Admitting: *Deleted

## 2018-03-23 NOTE — Telephone Encounter (Signed)
Celestia called to this RN to state she is having continued nausea - despite holding the tamoxifen.  Per discussion- nausea is mildly relieved " with chocolate milk of all things !"  This RN discussed above- with recommendation to resume tamoxifen and institute OTC pepcid ( pt is not on any antiacids)   Pt is seeing Dr Forde Dandy for lab recheck per noted low potassium and she will inform him of above as well.  No further needs at this time.

## 2018-03-28 ENCOUNTER — Other Ambulatory Visit: Payer: Self-pay | Admitting: Obstetrics & Gynecology

## 2018-03-28 ENCOUNTER — Telehealth: Payer: Self-pay | Admitting: Obstetrics & Gynecology

## 2018-03-28 NOTE — Telephone Encounter (Signed)
Patient called to let Dr. Sabra Heck know 300 MGS of "gabapentin is working better but not great." She is requesting refills on this medication to be sent to her pharmacy on file. She said she is almost out and will be by Saturday, 04/01/18.

## 2018-03-28 NOTE — Telephone Encounter (Signed)
Previous note closed in error. Patient called to let Dr. Sabra Heck know 300 MGS of "gabapentin is working better but not great." She is requesting refills on this medication to be sent to her pharmacy on file. She said she is almost out and will be by Saturday, 04/01/18.

## 2018-03-29 MED ORDER — GABAPENTIN 300 MG PO CAPS: ORAL_CAPSULE | ORAL | 1 refills | Status: DC

## 2018-03-29 NOTE — Telephone Encounter (Signed)
Message left to return call to Michelle Fernandez at [phone].    

## 2018-03-29 NOTE — Telephone Encounter (Signed)
Patient returning call to Emily.

## 2018-03-29 NOTE — Telephone Encounter (Signed)
Call to patient. Message given to patient as seen below from Dr. Sabra Heck. Advised patient to return call to office if she feels she needs to increase dosage. Patient agreeable.   Encounter closed.

## 2018-03-29 NOTE — Telephone Encounter (Signed)
I will change rx to 300mg  and she can get it now because the dosage is different.  If feels she should increased dosage further, please let me know and I will RF the 100mg  as well.  Thanks.

## 2018-03-29 NOTE — Telephone Encounter (Signed)
Medication refill request: gabapentin 300mg  Last AEX:  07-05-17 SM  Next AEX: 08-22-18  Last MMG (if hormonal medication request): 02-01-18 U/S BIRADS 1 negative  Refill authorized: please advise.   Patient returned call. States she is now taking 300mg  of gabapentin. States she will run out of medication on Saturday 04-01-18 due to increasing dosage and the pharmacy will not let her fill earlier. RN advised would send request to Dr. Sabra Heck. Patient agreeable. Pharmacy on file confirmed.

## 2018-04-23 ENCOUNTER — Other Ambulatory Visit: Payer: Self-pay | Admitting: Obstetrics & Gynecology

## 2018-04-24 ENCOUNTER — Other Ambulatory Visit: Payer: Self-pay

## 2018-04-24 MED ORDER — GABAPENTIN 300 MG PO CAPS: ORAL_CAPSULE | ORAL | 1 refills | Status: DC

## 2018-04-24 NOTE — Telephone Encounter (Signed)
Medication refill request: Gabapentin 300mg   Last AEX:  07/05/17 Next AEX: 08/22/18 Last MMG (if hormonal medication request): 01/31/18  Density B ultrasound no evidence of malignancy repeat in 1 year  Refill authorized: #90 with 0RF

## 2018-05-31 ENCOUNTER — Other Ambulatory Visit: Payer: Self-pay

## 2018-05-31 DIAGNOSIS — D0511 Intraductal carcinoma in situ of right breast: Secondary | ICD-10-CM

## 2018-05-31 NOTE — Progress Notes (Signed)
[place]  [phone] [phone]     ID: Michelle Fernandez DOB: 1960/06/29  MR#: [id number]  ELF#:[id number]  Patient Care Team: Michelle Bowen, MD as PCP - General (Endocrinology) Michelle Bookbinder, MD as Consulting Physician (General Surgery) Michelle Fernandez, Michelle Dad, MD as Consulting Physician (Oncology) Michelle Pray, MD as Consulting Physician (Radiation Oncology) OTHER MD:  CHIEF COMPLAINT: Ductal carcinoma in situ, estrogen receptor positive  CURRENT TREATMENT: [Tamoxifen]   HISTORY OF CURRENT ILLNESS: From the original intake note:  Michelle Fernandez had routine screening mammography at [facility] 01/14/2016 showing an area of grouped calcifications in the central right breast. She was set up for six-month follow-up 07/20/2016 and this showed them to be stable, and probably benign, but repeat 6 month follow-up was recommended and on 01/25/2017 the patient underwent bilateral diagnostic mammography showing the breast density to be category C. In the right breast retroareolar area there was a new group of amorphous calcifications measuring 0.6 cm. Biopsy of this area 02/01/2017 showed ([id number]) ductal carcinoma in situ, intermediate grade, estrogen and progesterone receptor positive.  Right axillary ultrasound 02/01/2017 was sonographically benign  The patient's subsequent history is as detailed below.  INTERVAL HISTORY: Michelle Fernandez returns today for follow-up of her ductal carcinoma in situ. She is doing well. She continues on tamoxifen with fair tolerance. She reports nausea, which has been improving since she started taking it at night. She reports intense hot flashes that seem to last longer. But when she was off tamoxifen for 3 weeks, she did not notice any difference in the hot flashes. She is taking Effexor and Gabapentin to help alleviate hot flashes at night.  Since her last visit, she underwent diagnostic bilateral mammogram on 02/01/2018 at [facility],  with results revealing: breast density category C; indeterminate 0.5 cm left breast mass. Follow up ultrasound showed multiple simple cysts, no sonographic evidence of malignancy.   REVIEW OF SYSTEMS:  Michelle Fernandez reports she took care of her 69 year old mom over the holidays. Her mother lives in [place] and has dementia and lung cancer. She reports her mother-in-law and youngest daughter are in the process of moving. The patient denies unusual headaches, visual changes, nausea, vomiting, stiff neck, dizziness, or gait imbalance. There has been no cough, phlegm production, or pleurisy, no chest pain or pressure, and no change in bowel or bladder habits. The patient denies fever, rash, bleeding, unexplained fatigue or unexplained weight loss. A detailed review of systems was otherwise entirely negative.   PAST MEDICAL HISTORY: Past Medical History:  Diagnosis Date  . Breast cancer ([place])   . Endometriosis   . Family history of breast cancer   . Genetic testing 02/10/2017   STAT Breast panel with reflex to Common Cancers panel (47 genes) - No pathogenic mutations detected  . Graves disease   . 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 History of radiation therapy 05/17/17-06/13/17   right breast 40.05 Gy in 15 fractions, right breast boost 10 Gy in 5 fractions  . Hypertension   . Migraine   . Personal history of asthma   . PONV (postoperative nausea and vomiting)    "really sick when I wake up"    PAST SURGICAL HISTORY: Past Surgical History:  Procedure Laterality Date  . ANTERIOR CRUCIATE LIGAMENT REPAIR Left 1991  . BREAST LUMPECTOMY WITH RADIOACTIVE SEED LOCALIZATION Right 04/04/2017   Procedure: RIGHT BREAST LUMPECTOMY WITH RADIOACTIVE SEED LOCALIZATION;  Surgeon: Michelle Bookbinder, MD;  Location: [facility];  Service: General;  Laterality:  Right;  [place] CHOLECYSTECTOMY, LAPAROSCOPIC  2001  . [place]  2001   from lap chole  . MENISCUS REPAIR Left 1993  . ULNAR TUNNEL RELEASE Right   . VAGINAL  HYSTERECTOMY  1988   for endometriosos    FAMILY HISTORY Family History  Problem Relation Age of Onset  . Breast cancer Mother 78       TAH/BSO 57s; currently 58  . Diabetes Mellitus I Mother   . Lung cancer Mother 22  . Dementia Mother   . Heart attack Father   . Breast cancer Maternal Aunt 75       currently 33s  . Multiple myeloma Sister 22  . Cancer Maternal Uncle        unk. type; deceased 80s  . Cancer Cousin        son of mat uncle with cancer; unk. type; deceased 27s  Her father died at age 96 from a MI. Her mother is 41 years old as of October 2018. The patient's mother has a history of lung cancer diagnosed at age 62 and breast cancer diagnosed at age 35. Pt has 3 sisters. One sister has myeloma. Her maternal aunt was diagnosed at age 76 with breast cancer.   GYNECOLOGIC HISTORY:  Patient's last menstrual period was 05/03/1986. Menarche: 58 years old Age at first live birth: 58 years old GP: GXP2 LMP: At age 55 due to a partial hysterectomy (no salpingo-oophorectomy). 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 She lives at home with her Husband, Michelle Fernandez, who is a Administrator. She has an outside dog. Daughter Michelle Fernandez lives in [place] and works as a Theme park manager. Daughter Michelle Fernandez lives in [place] and is a "stay at home mom". The patient has 6 grandchildren, all boys.    ADVANCED DIRECTIVES: Yes, her husband is her healthcare POA.    HEALTH MAINTENANCE: Social History   Tobacco Use  . Smoking status: Former Smoker    Types: Cigarettes    Last attempt to quit: 05/05/2006    Years since quitting: 12.0  . Smokeless tobacco: Never Used  Substance Use Topics  . Alcohol use: No  . Drug use: No     Colonoscopy: No  PAP: Last on 03/18/2015 and negative.  Bone density: No   Allergies  Allergen Reactions  . Phenergan [Promethazine Hcl] Nausea Only    Current Outpatient Medications  Medication Sig Dispense  Refill  . fluconazole (DIFLUCAN) 150 MG tablet Take 1 tab po x 1, repeat in 72 hours if needed 2 tablet 0  . gabapentin (NEURONTIN) 300 MG capsule Take 360m nightly 90 capsule 1  . KLOR-CON M20 20 MEQ tablet Take 20 mEq by mouth daily.  5  . metoprolol succinate (TOPROL-XL) 25 MG 24 hr tablet Take 12.5 mg daily by mouth.     . rosuvastatin (CRESTOR) 10 MG tablet Take 10 mg daily by mouth.     . SYNTHROID 125 MCG tablet Take 125 mcg by mouth daily.  6  . terconazole (TERAZOL 7) 0.4 % vaginal cream Place 1 applicator vaginally at bedtime. 45 g 0  . venlafaxine XR (EFFEXOR-XR) 75 MG 24 hr capsule TAKE 1 CAPSULE DAILY WITH BREAKFAST 90 capsule 3  . ZETIA 10 MG tablet Take 10 mg daily by mouth.      No current facility-administered medications for this visit.     OBJECTIVE: Middle-aged white woman in no acute distress  Vitals:   06/01/18  1523  BP: (!) 159/86  Pulse: 86  Resp: 18  Temp: 98.2 F (36.8 C)  SpO2: 98%     Body mass index is 29.76 kg/m.   Wt Readings from Last 3 Encounters:  06/01/18 168 lb (76.2 kg)  03/17/18 166 lb (75.3 kg)  11/29/17 160 lb 6.4 oz (72.8 kg)      ECOG FS:1 - Symptomatic but completely ambulatory  Sclerae unicteric, EOMs intact Oropharynx clear and moist No cervical or supraclavicular adenopathy Lungs no rales or rhonchi Heart regular rate and rhythm Abd soft, nontender, positive bowel sounds MSK no focal spinal tenderness, no upper extremity lymphedema Neuro: nonfocal, well oriented, appropriate affect Breasts: The right breast is status post lumpectomy and radiation, with no evidence of disease recurrence.  The left breast is benign.  Both axillae are benign.   LAB RESULTS:  CMP     Component Value Date/Time   NA 138 03/28/2017 0900   NA 142 02/09/2017 0846   K 3.6 03/28/2017 0900   K 3.3 (L) 02/09/2017 0846   CL 103 03/28/2017 0900   CO2 26 03/28/2017 0900   CO2 27 02/09/2017 0846   GLUCOSE 87 03/28/2017 0900   GLUCOSE 87  02/09/2017 0846   BUN <5 (L) 03/28/2017 0900   BUN 6.8 (L) 02/09/2017 0846   CREATININE 0.61 03/28/2017 0900   CREATININE 0.7 02/09/2017 0846   CALCIUM 9.5 03/28/2017 0900   CALCIUM 10.2 02/09/2017 0846   PROT 7.6 02/09/2017 0846   ALBUMIN 4.3 02/09/2017 0846   AST 21 02/09/2017 0846   ALT 25 02/09/2017 0846   ALKPHOS 58 02/09/2017 0846   BILITOT 0.53 02/09/2017 0846   GFRNONAA >60 03/28/2017 0900   GFRAA >60 03/28/2017 0900    No results found for: TOTALPROTELP, ALBUMINELP, A1GS, A2GS, BETS, BETA2SER, GAMS, MSPIKE, SPEI  No results found for: KPAFRELGTCHN, LAMBDASER, KAPLAMBRATIO  Lab Results  Component Value Date   WBC 5.7 06/01/2018   NEUTROABS 3.4 06/01/2018   HGB 13.4 06/01/2018   HCT 39.8 06/01/2018   MCV 89.6 06/01/2018   PLT 141 (L) 06/01/2018    _0 @    No results found for: LABCA2  No components found for: [id number]  No results for input(s): INR in the last 168 hours.  No results found for: LABCA2  No results found for: WUJ811  No results found for: BJY782  No results found for: NFA213  No results found for: CA2729  No components found for: HGQUANT  No results found for: CEA1 / No results found for: CEA1   No results found for: AFPTUMOR  No results found for: CHROMOGRNA  No results found for: PSA1  Appointment on 06/01/2018  Component Date Value Ref Range Status  . WBC Count 06/01/2018 5.7  4.0 - 10.5 K/uL Final  . RBC 06/01/2018 4.44  3.87 - 5.11 MIL/uL Final  . Hemoglobin 06/01/2018 13.4  12.0 - 15.0 g/dL Final  . HCT 06/01/2018 39.8  36.0 - 46.0 % Final  . MCV 06/01/2018 89.6  80.0 - 100.0 fL Final  . MCH 06/01/2018 30.2  26.0 - 34.0 pg Final  . MCHC 06/01/2018 33.7  30.0 - 36.0 g/dL Final  . RDW 06/01/2018 11.9  11.5 - 15.5 % Final  . Platelet Count 06/01/2018 141* 150 - 400 K/uL Final  . nRBC 06/01/2018 0.0  0.0 - 0.2 % Final  . Neutrophils Relative % 06/01/2018 60  % Final  . Neutro Abs 06/01/2018 3.4  1.7 - 7.7 K/uL  Final  .  Lymphocytes Relative 06/01/2018 30  % Final  . Lymphs Abs 06/01/2018 1.7  0.7 - 4.0 K/uL Final  . Monocytes Relative 06/01/2018 7  % Final  . Monocytes Absolute 06/01/2018 0.4  0.1 - 1.0 K/uL Final  . Eosinophils Relative 06/01/2018 2  % Final  . Eosinophils Absolute 06/01/2018 0.1  0.0 - 0.5 K/uL Final  . Basophils Relative 06/01/2018 1  % Final  . Basophils Absolute 06/01/2018 0.0  0.0 - 0.1 K/uL Final  . Immature Granulocytes 06/01/2018 0  % Final  . Abs Immature Granulocytes 06/01/2018 0.01  0.00 - 0.07 K/uL Final   Performed at [facility], [address] [place], [place] [address]    (this displays the last labs from the last 3 days)  No results found for: TOTALPROTELP, ALBUMINELP, A1GS, A2GS, BETS, BETA2SER, GAMS, MSPIKE, SPEI (this displays SPEP labs)  No results found for: KPAFRELGTCHN, LAMBDASER, KAPLAMBRATIO (kappa/lambda light chains)  No results found for: HGBA, HGBA2QUANT, HGBFQUANT, HGBSQUAN (Hemoglobinopathy evaluation)   No results found for: LDH  No results found for: IRON, TIBC, IRONPCTSAT (Iron and TIBC)  No results found for: FERRITIN  Urinalysis    Component Value Date/Time   BILIRUBINUR N 03/17/2018 1513   PROTEINUR Negative 03/17/2018 1513   UROBILINOGEN 0.2 03/17/2018 1513   NITRITE N 03/17/2018 1513   LEUKOCYTESUR Negative 03/17/2018 1513     STUDIES: Mammography and ultrasonography results discussed with the patient  ELIGIBLE FOR AVAILABLE RESEARCH PROTOCOL: COMET, declined  ASSESSMENT: 58 y.o. [place] woman Status post right breast retroareolar biopsy 02/01/2017 for ductal carcinoma in situ grade 2, estrogen and progesterone receptor positive  (1) genetics testing through the STAT Breast panel with reflex to  [facility]'sCommon Cancers panel (47 genes) -found no pathogenic mutations in APC, ATM, AXIN2, BARD1, BMPR1A, BRCA1, BRCA2, BRIP1, CDH1, CDK4, CDKN2A, CHEK2, CTNNA1, DICER1, EPCAM, GREM1, HOXB13,  KIT, MEN1, MLH1, MSH2, MSH3, MSH6, MUTYH, NBN, NF1, NTHL1, PALB2, PDGFRA, PMS2, POLD1, POLE, PTEN, RAD50, RAD51C, RAD51D, SDHA, SDHB, SDHC, SDHD, SMAD4, SMARCA4, STK11, TP53, TSC1, TSC2, VHL.   (2) post right lumpectomy 04/04/2017 showing only residual atypical ductal hyperplasia  (3) adjuvant radiation 05/17/17-06/13/17: 40.05 Gy to the right breast with 10 Gy boost  (4) tamoxifen started 08/17/2017.  (5) intermittent tobacco abuse: Has definitively quit smoking as of September 2018.  She says she has a 5 pack year cumulative tobacco history. (10 years all together with 1/2ppd)  PLAN: Michelle Fernandez is now a little over 1 year out definitive surgery for her noninvasive breast cancer with no evidence of disease activity.  This is favorable  She is struggling with the tamoxifen.  She is having nausea from it even though she is taking it at bedtime.  She is having significant hot flashes as well.  Given that she has noninvasive disease, and that she did have radiation, antiestrogens are to a degree optional.  We decided after much discussion that she would take 2 months off and call us the first week in April.  If all her symptoms have significantly improved then she will either stay off tamoxifen or she will consider anastrozole.  However if the symptoms are really no better than it was all due to menopause and she might as well continue on tamoxifen  I also think that she is working very hard on not taking any time to herself.  I wrote her a prescription for 7 days off.  I think actually a long weekend with her husband, especially if they drive  to a warmer place, would be very beneficial  She will see me again in 1 year.  She knows to call for any other issues that may develop before the next visit.  Michelle Fernandez. Jamekia Gannett, MD 06/01/18 3:46 PM Medical Oncology and Hematology [facility] [address] [place], [place] [address] Tel. [phone]    Fax. [phone]   I, Wilburn Mylar, am acting as scribe for Dr. Virgie Fernandez. Sanjiv Castorena.  I, Lurline Del MD, have reviewed the above documentation for accuracy and completeness, and I agree with the above.

## 2018-06-01 ENCOUNTER — Inpatient Hospital Stay: Payer: No Typology Code available for payment source | Attending: Oncology

## 2018-06-01 ENCOUNTER — Inpatient Hospital Stay: Payer: No Typology Code available for payment source | Admitting: Oncology

## 2018-06-01 ENCOUNTER — Telehealth: Payer: Self-pay | Admitting: Oncology

## 2018-06-01 VITALS — BP 159/86 | HR 86 | Temp 98.2°F | Resp 18 | Ht 63.0 in | Wt 168.0 lb

## 2018-06-01 DIAGNOSIS — D0511 Intraductal carcinoma in situ of right breast: Secondary | ICD-10-CM | POA: Diagnosis present

## 2018-06-01 DIAGNOSIS — Z79810 Long term (current) use of selective estrogen receptor modulators (SERMs): Secondary | ICD-10-CM | POA: Insufficient documentation

## 2018-06-01 DIAGNOSIS — N951 Menopausal and female climacteric states: Secondary | ICD-10-CM

## 2018-06-01 DIAGNOSIS — Z170 Estrogen receptor positive status [ER+]: Secondary | ICD-10-CM | POA: Diagnosis not present

## 2018-06-01 LAB — CBC WITH DIFFERENTIAL (CANCER CENTER ONLY)
Abs Immature Granulocytes: 0.01 10*3/uL (ref 0.00–0.07)
Basophils Absolute: 0 10*3/uL (ref 0.0–0.1)
Basophils Relative: 1 %
Eosinophils Absolute: 0.1 10*3/uL (ref 0.0–0.5)
Eosinophils Relative: 2 %
HCT: 39.8 % (ref 36.0–46.0)
Hemoglobin: 13.4 g/dL (ref 12.0–15.0)
Immature Granulocytes: 0 %
LYMPHS ABS: 1.7 10*3/uL (ref 0.7–4.0)
Lymphocytes Relative: 30 %
MCH: 30.2 pg (ref 26.0–34.0)
MCHC: 33.7 g/dL (ref 30.0–36.0)
MCV: 89.6 fL (ref 80.0–100.0)
Monocytes Absolute: 0.4 10*3/uL (ref 0.1–1.0)
Monocytes Relative: 7 %
NRBC: 0 % (ref 0.0–0.2)
Neutro Abs: 3.4 10*3/uL (ref 1.7–7.7)
Neutrophils Relative %: 60 %
Platelet Count: 141 10*3/uL — ABNORMAL LOW (ref 150–400)
RBC: 4.44 MIL/uL (ref 3.87–5.11)
RDW: 11.9 % (ref 11.5–15.5)
WBC: 5.7 10*3/uL (ref 4.0–10.5)

## 2018-06-01 LAB — CMP (CANCER CENTER ONLY)
ALT: 47 U/L — ABNORMAL HIGH (ref 0–44)
AST: 45 U/L — ABNORMAL HIGH (ref 15–41)
Albumin: 3.9 g/dL (ref 3.5–5.0)
Alkaline Phosphatase: 81 U/L (ref 38–126)
Anion gap: 10 (ref 5–15)
BUN: 7 mg/dL (ref 6–20)
CHLORIDE: 103 mmol/L (ref 98–111)
CO2: 29 mmol/L (ref 22–32)
Calcium: 9.5 mg/dL (ref 8.9–10.3)
Creatinine: 0.77 mg/dL (ref 0.44–1.00)
GFR, Est AFR Am: >60 mL/min (ref >60)
Glucose, Bld: 169 mg/dL — ABNORMAL HIGH (ref 70–99)
Potassium: 3.1 mmol/L — ABNORMAL LOW (ref 3.5–5.1)
Sodium: 142 mmol/L (ref 135–145)
Total Bilirubin: 0.4 mg/dL (ref 0.3–1.2)
Total Protein: 6.8 g/dL (ref 6.5–8.1)

## 2018-06-01 NOTE — Telephone Encounter (Signed)
Gave avs and calendar ° °

## 2018-08-22 ENCOUNTER — Ambulatory Visit: Payer: BLUE CROSS/BLUE SHIELD | Admitting: Obstetrics & Gynecology

## 2018-10-15 ENCOUNTER — Other Ambulatory Visit: Payer: Self-pay | Admitting: Obstetrics & Gynecology

## 2018-10-16 ENCOUNTER — Ambulatory Visit: Payer: No Typology Code available for payment source | Admitting: Obstetrics & Gynecology

## 2018-10-16 NOTE — Telephone Encounter (Signed)
Medication refill request: Gabapentin Last AEX:  07-05-17 SM  Next AEX: 10-27-2018  Last MMG (if hormonal medication request): n/a Refill authorized: today, please advise.   Medication pended for #90, 0RF. Please refill if appropriate.

## 2018-10-27 ENCOUNTER — Encounter: Payer: Self-pay | Admitting: Obstetrics & Gynecology

## 2018-10-27 ENCOUNTER — Other Ambulatory Visit: Payer: Self-pay

## 2018-10-27 ENCOUNTER — Ambulatory Visit: Payer: PRIVATE HEALTH INSURANCE | Admitting: Obstetrics & Gynecology

## 2018-10-27 VITALS — BP 160/96 | HR 88 | Temp 97.3°F | Ht 63.0 in | Wt 164.0 lb

## 2018-10-27 DIAGNOSIS — Z01419 Encounter for gynecological examination (general) (routine) without abnormal findings: Secondary | ICD-10-CM | POA: Diagnosis not present

## 2018-10-27 NOTE — Progress Notes (Addendum)
58 y.o. G2P2 Married White or Caucasian female here for annual exam.  Had DCIS with lumpectomy and radiation.  She was then treated with Tamoxifen but stopped after about a year due to nausea.  She is still having hot flashes.  These are no better off Tamoxifen.  She is on both gabapentin and Effexor.    Denies vaginal bleeding.     PCP:  Dr. Forde Dandy.  Had appt 6 months ago.  Having trouble with potassium level.  Stopped taking fluid pill and taking potassium supplement every other day.  Has repeat blood work retested.    Patient's last menstrual period was 05/03/1986.          Sexually active: Yes.    The current method of family planning is status post hysterectomy.    Exercising: No.   Smoker:  no  Health Maintenance: Pap:  2016 neg  History of abnormal Pap:  no MMG:  02/01/18 [place] Left BIRADS1:neg. F/u 1 year  Colonoscopy:  Never.  D/w pt today.  She is going to check about cologuard being covered by insurance.   BMD:   Never TDaP:  Unsure (sees Dr. Forde Dandy) Pneumonia vaccine(s):  n/a Shingrix:   No Hep C testing: 03/24/16 neg  Screening Labs: PCP   reports that she quit smoking about 12 years ago. Her smoking use included cigarettes. She has never used smokeless tobacco. She reports that she does not drink alcohol or use drugs.  Past Medical History:  Diagnosis Date  . Breast cancer ([place])   . Endometriosis   . Family history of breast cancer   . Genetic testing 02/10/2017   STAT Breast panel with reflex to Common Cancers panel (47 genes) - No pathogenic mutations detected  . Graves disease   . 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 History of radiation therapy 05/17/17-06/13/17   right breast 40.05 Gy in 15 fractions, right breast boost 10 Gy in 5 fractions  . Hypertension   . Migraine   . Personal history of asthma   . PONV (postoperative nausea and vomiting)    "really sick when I wake up"    Past Surgical History:  Procedure Laterality Date  . ANTERIOR CRUCIATE LIGAMENT REPAIR Left 1991   . BREAST LUMPECTOMY WITH RADIOACTIVE SEED LOCALIZATION Right 04/04/2017   Procedure: RIGHT BREAST LUMPECTOMY WITH RADIOACTIVE SEED LOCALIZATION;  Surgeon: Rolm Bookbinder, MD;  Location: [facility];  Service: General;  Laterality: Right;  . CHOLECYSTECTOMY, LAPAROSCOPIC  2001  . [place]  2001   from lap chole  . MENISCUS REPAIR Left 1993  . ULNAR TUNNEL RELEASE Right   . VAGINAL HYSTERECTOMY  1988   for endometriosos    Current Outpatient Medications  Medication Sig Dispense Refill  . gabapentin (NEURONTIN) 300 MG capsule TAKE 1 CAPSULE BY MOUTH NIGHTLY 90 capsule 0  . KLOR-CON M20 20 MEQ tablet Take 20 mEq by mouth daily.  5  . metoprolol succinate (TOPROL-XL) 25 MG 24 hr tablet Take 12.5 mg daily by mouth.     . rosuvastatin (CRESTOR) 10 MG tablet Take 10 mg daily by mouth.     . SYNTHROID 125 MCG tablet Take 125 mcg by mouth daily.  6  . venlafaxine XR (EFFEXOR-XR) 75 MG 24 hr capsule TAKE 1 CAPSULE DAILY WITH BREAKFAST 90 capsule 3  . ZETIA 10 MG tablet Take 10 mg daily by mouth.      No current facility-administered medications for this visit.     Family History  Problem  Relation Age of Onset  . Breast cancer Mother 9       TAH/BSO 27s; currently 55  . Diabetes Mellitus I Mother   . Lung cancer Mother 2  . Dementia Mother   . Heart attack Father   . Breast cancer Maternal Aunt 43       currently 69s  . Multiple myeloma Sister 11  . Cancer Maternal Uncle        unk. type; deceased 32s  . Cancer Cousin        son of mat uncle with cancer; unk. type; deceased 41s    Review of Systems  All other systems reviewed and are negative.   Exam:   BP (!) 160/96   Pulse 88   Temp (!) 97.3 F (36.3 C) (Temporal)   Ht 5' 3"  (1.6 m)   Wt 164 lb (74.4 kg)   LMP 05/03/1986   BMI 29.05 kg/m   Height: 5' 3"  (160 cm)  Ht Readings from Last 3 Encounters:  10/27/18 5' 3"  (1.6 m)  06/01/18 5' 3"  (1.6 m)  03/17/18 5' 3"  (1.6 m)    General appearance: alert,  cooperative and appears stated age Head: Normocephalic, without obvious abnormality, atraumatic Neck: no adenopathy, supple, symmetrical, trachea midline and thyroid normal to inspection and palpation Lungs: clear to auscultation bilaterally Breasts: no masses, nipple discharge, LAD; right breast incision well healed with radiation changes that are stable Heart: regular rate and rhythm Abdomen: soft, non-tender; bowel sounds normal; no masses,  no organomegaly Extremities: extremities normal, atraumatic, no cyanosis or edema Skin: Skin color, texture, turgor normal. No rashes or lesions Lymph nodes: Cervical, supraclavicular, and axillary nodes normal. No abnormal inguinal nodes palpated Neurologic: Grossly normal   Pelvic: External genitalia:  no lesions              Urethra:  normal appearing urethra with no masses, tenderness or lesions              Bartholins and Skenes: normal                 Vagina: normal appearing vagina with normal color and discharge, no lesions              Cervix: absent              Pap taken: No. Bimanual Exam:  Uterus:  uterus absent              Adnexa: no mass, fullness, tenderness               Rectovaginal: Confirms               Anus:  normal sphincter tone, no lesions  Chaperone was present for exam.  A:  Well Woman with normal exam PMP, no HRT H/O DCIS s/p lumpectomy and radiation.  Off Tamoxifen. Hypothyroidism Hypertension  P:   Mammogram guidelines reviewed.  Doing 3D MMG.   Limited MRI discussed.  Information provided.  Tyrer Cusick model 15.5% lifetime risks. pap smear not indicated Lab work done with Dr. Forde Dandy Colonoscopy screening guidelines reviewed.  Pt wants to check insurance. BMD order will be faxed to [facility] to do with MMG in October Return annually or prn

## 2019-01-14 ENCOUNTER — Other Ambulatory Visit: Payer: Self-pay | Admitting: Obstetrics & Gynecology

## 2019-01-15 NOTE — Telephone Encounter (Signed)
Medication refill request: gabapentin  Last AEX:  10/27/18 Next AEX:01/03/20 Last MMG (if hormonal medication request):04/03/18 Bi-rads 1 neg  Refill authorized: #90 with 0RF

## 2019-01-17 NOTE — Telephone Encounter (Signed)
Patient is calling regarding refill request for gabapentin.

## 2019-01-17 NOTE — Telephone Encounter (Signed)
Medication refill request: Gabapentin Last AEX:  10/27/2018 SM Next AEX: 01/03/2020 Last MMG (if hormonal medication request): BIRADS 4 Suspicious for malignancy Density C Refill authorized:

## 2019-01-25 ENCOUNTER — Other Ambulatory Visit: Payer: Self-pay | Admitting: Endocrinology

## 2019-01-25 DIAGNOSIS — R7401 Elevation of levels of liver transaminase levels: Secondary | ICD-10-CM

## 2019-02-01 ENCOUNTER — Ambulatory Visit
Admission: RE | Admit: 2019-02-01 | Discharge: 2019-02-01 | Disposition: A | Discharge status: Home / Self Care | Payer: No Typology Code available for payment source | Source: Ambulatory Visit | Attending: Endocrinology | Admitting: Endocrinology

## 2019-02-01 DIAGNOSIS — R7401 Elevation of levels of liver transaminase levels: Secondary | ICD-10-CM

## 2019-02-03 ENCOUNTER — Other Ambulatory Visit: Payer: Self-pay | Admitting: Oncology

## 2019-02-03 DIAGNOSIS — D0511 Intraductal carcinoma in situ of right breast: Secondary | ICD-10-CM

## 2019-02-06 ENCOUNTER — Encounter: Payer: Self-pay | Admitting: Obstetrics & Gynecology

## 2019-02-06 ENCOUNTER — Encounter: Payer: Self-pay | Admitting: Physician Assistant

## 2019-02-15 ENCOUNTER — Ambulatory Visit: Payer: No Typology Code available for payment source | Admitting: Physician Assistant

## 2019-02-15 ENCOUNTER — Other Ambulatory Visit (INDEPENDENT_AMBULATORY_CARE_PROVIDER_SITE_OTHER): Payer: No Typology Code available for payment source

## 2019-02-15 ENCOUNTER — Other Ambulatory Visit: Payer: Self-pay

## 2019-02-15 ENCOUNTER — Encounter: Payer: Self-pay | Admitting: Physician Assistant

## 2019-02-15 VITALS — BP 148/88 | HR 95 | Temp 98.7°F | Ht 63.0 in | Wt 163.0 lb

## 2019-02-15 DIAGNOSIS — R7989 Other specified abnormal findings of blood chemistry: Secondary | ICD-10-CM

## 2019-02-15 DIAGNOSIS — R112 Nausea with vomiting, unspecified: Secondary | ICD-10-CM

## 2019-02-15 DIAGNOSIS — Z1212 Encounter for screening for malignant neoplasm of rectum: Secondary | ICD-10-CM

## 2019-02-15 DIAGNOSIS — Z1211 Encounter for screening for malignant neoplasm of colon: Secondary | ICD-10-CM

## 2019-02-15 DIAGNOSIS — K219 Gastro-esophageal reflux disease without esophagitis: Secondary | ICD-10-CM

## 2019-02-15 DIAGNOSIS — Z1159 Encounter for screening for other viral diseases: Secondary | ICD-10-CM

## 2019-02-15 LAB — CBC WITH DIFFERENTIAL/PLATELET
Basophils Absolute: 0 10*3/uL (ref 0.0–0.1)
Basophils Relative: 0.8 % (ref 0.0–3.0)
Eosinophils Absolute: 0.1 10*3/uL (ref 0.0–0.7)
Eosinophils Relative: 1.8 % (ref 0.0–5.0)
HCT: 42.8 % (ref 36.0–46.0)
Hemoglobin: 14.5 g/dL (ref 12.0–15.0)
Lymphocytes Relative: 23.9 % (ref 12.0–46.0)
Lymphs Abs: 1.4 10*3/uL (ref 0.7–4.0)
MCHC: 34 g/dL (ref 30.0–36.0)
MCV: 90 fl (ref 78.0–100.0)
Monocytes Absolute: 0.4 10*3/uL (ref 0.1–1.0)
Monocytes Relative: 6.2 % (ref 3.0–12.0)
Neutro Abs: 3.9 10*3/uL (ref 1.4–7.7)
Neutrophils Relative %: 67.3 % (ref 43.0–77.0)
Platelets: 130 10*3/uL — ABNORMAL LOW (ref 150.0–400.0)
RBC: 4.75 Mil/uL (ref 3.87–5.11)
RDW: 13.2 % (ref 11.5–15.5)
WBC: 5.7 10*3/uL (ref 4.0–10.5)

## 2019-02-15 LAB — COMPREHENSIVE METABOLIC PANEL
ALT: 106 U/L — ABNORMAL HIGH (ref 0–35)
AST: 126 U/L — ABNORMAL HIGH (ref 0–37)
Albumin: 4.5 g/dL (ref 3.5–5.2)
Alkaline Phosphatase: 104 U/L (ref 39–117)
BUN: 6 mg/dL (ref 6–23)
CO2: 26 mEq/L (ref 19–32)
Calcium: 10 mg/dL (ref 8.4–10.5)
Chloride: 104 mEq/L (ref 96–112)
Creatinine, Ser: 0.53 mg/dL (ref 0.40–1.20)
GFR: 117.7 mL/min (ref >60.00)
Glucose, Bld: 98 mg/dL (ref 70–99)
Potassium: 3.8 mEq/L (ref 3.5–5.1)
Sodium: 138 mEq/L (ref 135–145)
Total Bilirubin: 0.5 mg/dL (ref 0.2–1.2)
Total Protein: 7.5 g/dL (ref 6.0–8.3)

## 2019-02-15 LAB — IBC + FERRITIN
Ferritin: 140.5 ng/mL (ref 10.0–291.0)
Iron: 112 ug/dL (ref 42–145)
Saturation Ratios: 28.5 % (ref 20.0–50.0)
Transferrin: 281 mg/dL (ref 212.0–360.0)

## 2019-02-15 LAB — PROTIME-INR
INR: 1.1 ratio — ABNORMAL HIGH (ref 0.8–1.0)
Prothrombin Time: 12.8 s (ref 9.6–13.1)

## 2019-02-15 MED ORDER — NA SULFATE-K SULFATE-MG SULF 17.5-3.13-1.6 GM/177ML PO SOLN: ORAL | 0 refills | Status: DC

## 2019-02-15 MED ORDER — METOCLOPRAMIDE HCL 10 MG PO TABS: 10.0000 mg | ORAL_TABLET | ORAL | 0 refills | Status: DC

## 2019-02-15 MED ORDER — OMEPRAZOLE 20 MG PO CPDR: 20.0000 mg | DELAYED_RELEASE_CAPSULE | Freq: Every day | ORAL | 5 refills | Status: DC

## 2019-02-15 NOTE — Patient Instructions (Addendum)
If you are age 60 or older, your body mass index should be between 23-30. Your Body mass index is 28.87 kg/m. If this is out of the aforementioned range listed, please consider follow up with your Primary Care Provider.  If you are age 46 or younger, your body mass index should be between 19-25. Your Body mass index is 28.87 kg/m. If this is out of the aformentioned range listed, please consider follow up with your Primary Care Provider.   You have been scheduled for a colonoscopy. Please follow written instructions given to you at your visit today.  Please pick up your prep supplies at the pharmacy within the next 1-3 days. If you use inhalers (even only as needed), please bring them with you on the day of your procedure. Your physician has requested that you go to www.startemmi.com and enter the access code given to you at your visit today. This web site gives a general overview about your procedure. However, you should still follow specific instructions given to you by our office regarding your preparation for the procedure.  We have sent the following medications to your pharmacy for you to pick up at your convenience: [facility] provider has requested that you go to the basement level for lab work before leaving today. Press "B" on the elevator. The lab is located at the first door on the left as you exit the elevator.  Due to recent COVID-19 restrictions implemented by [employer] and state authorities and in an effort to keep both patients and staff as safe as possible, [facility] requires COVID-19 testing prior to any scheduled endoscopic procedure. The testing center is located at [address] [place], [place] [address] in the [facility] suite.  Your appointment has been scheduled for 04/04/19 at 8 am.   Please bring your insurance cards to this appointment. You will require your COVID screen 2 business days prior to your  endoscopic procedure.  You are not required to quarantine after your screening.  You will only receive a phone call with the results if it is POSITIVE.  If you do not receive a call the day before your procedure you should begin your prep, if ordered, and you should report to the endo center for your procedure at your designated appointment arrival time ( one hour prior to the procedure time). There is no cost to you for the screening on the day of the swab.  [facility] will file with your insurance company for the testing.    You may receive an automated phone call prior to your procedure or have a message in your MyChart that you have an appointment for a BP/15 at the [facility], please disregard this message.  Your testing will be at the [address] , [place] [facility] location.   Thank you for choosing me and [facility].    Ellouise Newer, PA-C

## 2019-02-15 NOTE — Progress Notes (Signed)
Chief Complaint: Elevated LFTs  HPI:     Michelle Fernandez is a 57 year old female with a past medical history as listed below including breast cancer treated with DCIS with lumpectomy and radiation as well as tamoxifen, who was referred to me by Reynold Bowen, MD for a complaint of elevated LFTs.      06/01/2018 AST elevated at 45, ALT 47, 2 years previous these were normal.  CBC with a minimally decreased platelet count at 141.    02/01/2019 abdominal ultrasound with probable fatty infiltration of the liver and septated cyst of the right lobe of the liver 1.8 cm in the greatest size, post cholecystectomy.    Today, the patient tells me that ever since taking Tamoxifen she started with nausea and vomiting, she discontinued Tamoxifen almost a year ago and has continued with some nausea and vomiting occasionally, which the cancer center feels is related.    Mainly patient presents clinic today because she had a finding of elevated liver enzymes recently.  She was not told the results of her ultrasound and is unsure why they are elevated.  Denies previous blood transfusions, family history of liver disease, tattoos or hepatitis.    Does complain of reflux symptoms at least 4 days out of the week, sometimes multiple times a day.  She just did not want to "take another pill", so has not started any therapy for this yet.    Never had a previous colonoscopy, tells me that she does get nauseous easily and sometimes vomits so this is why she has not pursued one before because she is worried about the prep.    Denies fever, chills, weight loss, change in bowel habits, abdominal pain or blood in her stool.  Past Medical History:  Diagnosis Date  . Breast cancer ([place])   . Endometriosis   . Family history of breast cancer   . Genetic testing 02/10/2017   STAT Breast panel with reflex to Common Cancers panel (47 genes) - No pathogenic mutations detected  . Graves disease   . 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 History of  radiation therapy 05/17/17-06/13/17   right breast 40.05 Gy in 15 fractions, right breast boost 10 Gy in 5 fractions  . Hypertension   . Migraine   . Personal history of asthma   . PONV (postoperative nausea and vomiting)    "really sick when I wake up"    Past Surgical History:  Procedure Laterality Date  . ANTERIOR CRUCIATE LIGAMENT REPAIR Left 1991  . BREAST LUMPECTOMY WITH RADIOACTIVE SEED LOCALIZATION Right 04/04/2017   Procedure: RIGHT BREAST LUMPECTOMY WITH RADIOACTIVE SEED LOCALIZATION;  Surgeon: Rolm Bookbinder, MD;  Location: [place];  Service: General;  Laterality: Right;  . CHOLECYSTECTOMY, LAPAROSCOPIC  2001  . [place]  2001   from lap chole  . MENISCUS REPAIR Left 1993  . ULNAR TUNNEL RELEASE Right   . VAGINAL HYSTERECTOMY  1988   for endometriosos    Current Outpatient Medications  Medication Sig Dispense Refill  . gabapentin (NEURONTIN) 300 MG capsule TAKE 1 CAPSULE BY MOUTH NIGHTLY 90 capsule 4  . KLOR-CON M20 20 MEQ tablet Take 20 mEq by mouth daily.  5  . metoprolol succinate (TOPROL-XL) 25 MG 24 hr tablet Take 12.5 mg daily by mouth.     . rosuvastatin (CRESTOR) 10 MG tablet Take 10 mg daily by mouth.     . SYNTHROID 125 MCG tablet Take 125 mcg by mouth daily.  6  . venlafaxine  XR (EFFEXOR-XR) 75 MG 24 hr capsule TAKE 1 CAPSULE DAILY WITH BREAKFAST 90 capsule 3  . ZETIA 10 MG tablet Take 10 mg daily by mouth.      No current facility-administered medications for this visit.     Allergies as of 02/15/2019 - Review Complete 10/27/2018  Allergen Reaction Noted  . Phenergan [promethazine hcl] Nausea Only 03/21/2017    Family History  Problem Relation Age of Onset  . Breast cancer Mother 69       TAH/BSO 67s; currently 9  . Diabetes Mellitus I Mother   . Lung cancer Mother 45  . Dementia Mother   . Heart attack Father   . Breast cancer Maternal Aunt 57       currently 47s  . Multiple myeloma Sister 50  . Cancer Maternal Uncle         unk. type; deceased 20s  . Cancer Cousin        son of mat uncle with cancer; unk. type; deceased 77s    Social History   Socioeconomic History  . Marital status: Married    Spouse name: Not on file  . Number of children: Not on file  . Years of education: Not on file  . Highest education level: Not on file  Occupational History  . Occupation: Scientist, clinical (histocompatibility and immunogenetics)  Social Needs  . Financial resource strain: Not on file  . Food insecurity    Worry: Not on file    Inability: Not on file  . Transportation needs    Medical: Not on file    Non-medical: Not on file  Tobacco Use  . Smoking status: Former Smoker    Types: Cigarettes    Quit date: 05/05/2006    Years since quitting: 12.7  . Smokeless tobacco: Never Used  Substance and Sexual Activity  . Alcohol use: No  . Drug use: No  . Sexual activity: Yes    Partners: Male    Birth control/protection: Surgical    Comment: TVH  Lifestyle  . Physical activity    Days per week: Not on file    Minutes per session: Not on file  . Stress: Not on file  Relationships  . Social Herbalist on phone: Not on file    Gets together: Not on file    Attends religious service: Not on file    Active member of club or organization: Not on file    Attends meetings of clubs or organizations: Not on file    Relationship status: Not on file  . Intimate partner violence    Fear of current or ex partner: Not on file    Emotionally abused: Not on file    Physically abused: Not on file    Forced sexual activity: Not on file  Other Topics Concern  . Not on file  Social History Narrative  . Not on file    Review of Systems:    Constitutional: No weight loss, fever or chills Skin: No rash  Cardiovascular: No chest pain  Respiratory: No SOB  Gastrointestinal: See HPI and otherwise negative Genitourinary: No dysuria  Neurological: No headache, dizziness or syncope Musculoskeletal: No new muscle or joint pain Hematologic: No  bleeding Psychiatric: No history of depression or anxiety   Physical Exam:  Vital signs: BP (!) 148/88   Pulse 95   Temp 98.7 F (37.1 C)   Ht 5' 3"  (1.6 m)   Wt 163 lb (73.9 kg)   LMP 05/03/1986  BMI 28.87 kg/m    Constitutional:   Pleasant Caucasian female appears to be in NAD, Well developed, Well nourished, alert and cooperative Head:  Normocephalic and atraumatic. Eyes:   PEERL, EOMI. No icterus. Conjunctiva pink. Ears:  Normal auditory acuity. Neck:  Supple Throat: Oral cavity and pharynx without inflammation, swelling or lesion.  Respiratory: Respirations even and unlabored. Lungs clear to auscultation bilaterally.   No wheezes, crackles, or rhonchi.  Cardiovascular: Normal S1, S2. No MRG. Regular rate and rhythm. No peripheral edema, cyanosis or pallor.  Gastrointestinal:  Soft, nondistended, nontender. No rebound or guarding. Normal bowel sounds. No appreciable masses or hepatomegaly. Rectal:  Not performed.  Msk:  Symmetrical without gross deformities. Without edema, no deformity or joint abnormality.  Neurologic:  Alert and  oriented x4;  grossly normal neurologically.  Skin:   Dry and intact without significant lesions or rashes. Psychiatric: Demonstrates good judgement and reason without abnormal affect or behaviors.  RELEVANT LABS AND IMAGING: CBC    Component Value Date/Time   WBC 5.7 06/01/2018 1508   WBC 5.4 03/28/2017 0900   RBC 4.44 06/01/2018 1508   HGB 13.4 06/01/2018 1508   HGB 15.1 02/09/2017 0846   HCT 39.8 06/01/2018 1508   HCT 44.2 02/09/2017 0846   PLT 141 (L) 06/01/2018 1508   PLT 131 Large platelets present (L) 02/09/2017 0846   MCV 89.6 06/01/2018 1508   MCV 89.8 02/09/2017 0846   MCH 30.2 06/01/2018 1508   MCHC 33.7 06/01/2018 1508   RDW 11.9 06/01/2018 1508   RDW 12.3 02/09/2017 0846   LYMPHSABS 1.7 06/01/2018 1508   LYMPHSABS 1.6 02/09/2017 0846   MONOABS 0.4 06/01/2018 1508   MONOABS 0.5 02/09/2017 0846   EOSABS 0.1 06/01/2018  1508   EOSABS 0.1 02/09/2017 0846   BASOSABS 0.0 06/01/2018 1508   BASOSABS 0.0 02/09/2017 0846    CMP     Component Value Date/Time   NA 142 06/01/2018 1508   NA 142 02/09/2017 0846   K 3.1 (L) 06/01/2018 1508   K 3.3 (L) 02/09/2017 0846   CL 103 06/01/2018 1508   CO2 29 06/01/2018 1508   CO2 27 02/09/2017 0846   GLUCOSE 169 (H) 06/01/2018 1508   GLUCOSE 87 02/09/2017 0846   BUN 7 06/01/2018 1508   BUN 6.8 (L) 02/09/2017 0846   CREATININE 0.77 06/01/2018 1508   CREATININE 0.7 02/09/2017 0846   CALCIUM 9.5 06/01/2018 1508   CALCIUM 10.2 02/09/2017 0846   PROT 6.8 06/01/2018 1508   PROT 7.6 02/09/2017 0846   ALBUMIN 3.9 06/01/2018 1508   ALBUMIN 4.3 02/09/2017 0846   AST 45 (H) 06/01/2018 1508   AST 21 02/09/2017 0846   ALT 47 (H) 06/01/2018 1508   ALT 25 02/09/2017 0846   ALKPHOS 81 06/01/2018 1508   ALKPHOS 58 02/09/2017 0846   BILITOT 0.4 06/01/2018 1508   BILITOT 0.53 02/09/2017 0846   GFRNONAA >60 06/01/2018 1508   GFRAA >60 06/01/2018 1508    Assessment: 1.  Elevated LFTs: Mild transaminitis, ultrasound recently showing fatty liver; most likely fatty liver versus hepatitis versus autoimmune versus other 2.  Nausea/ vomiting: Ever since being on Tamoxifen, likely related, could have some type of reflux/gastritis 3.  GERD: 3 to 4 days a week 4.  Screening for colorectal cancer: Patient is 53 never had a screening colonoscopy  Plan: 1.  Scheduled patient for screening colonoscopy in the [place] with Dr. Rush Landmark.  Did discuss risks, benefits, limitations and alternatives and patient agrees  to proceed. 2.  Patient was prescribed Reglan 10 mg #2, 1 to be taken 20 to 30 minutes before the first half of prep and another to be taken before the second half. 3.  Prescribed Omeprazole 20 mg daily, 30-60 minutes before breakfast #30 with 5 refills. 4.  Reviewed antireflux diet and lifestyle modifications. 5.  Ordered further liver serologies to consider other causes of  elevated liver enzymes including PT/INR, ASMA, ANA, alpha-1 antitrypsin, AMA, ceruloplasmin, hepatitis labs and iron studies 6.  Did discuss fatty liver the patient as this is most likely the cause of her mild transaminitis given recent ultrasound confirming this.  Discussed a slow and steady weight loss of 1 to 2 pounds a week. 7.  Patient to follow in clinic per recommendations from Dr. Rush Landmark after time of procedure.  Ellouise Newer, PA-C [facility] 02/15/2019, 8:23 AM  Cc: Reynold Bowen, MD

## 2019-02-19 NOTE — Progress Notes (Signed)
Attending Physician's Attestation   I have reviewed the chart.   I agree with the Advanced Practitioner's note, impression, and recommendations with any updates as below.  Agree with need for further evaluation of her liver biochemical testing.  Her ANA is positive at 1-80 and her AMA is negative.  Anti-smooth muscle antibody pending.  Would plan to repeat liver tests in approximately 3 to 4 weeks as a complete metabolic panel, immunoglobulin profile and a hepatitis B core antibody. If anti-smooth muscle antibody returns positive we will need to query the role of a liver biopsy. Agree with moving forward with colonoscopy for screening purposes. As she will be undergoing anesthesia, I would add on a diagnostic endoscopy at time of her screening colonoscopy due to her symptoms of nausea and vomiting and GERD.   Justice Britain, MD [facility] Advanced Endoscopy Office # [phone]

## 2019-02-22 LAB — HEPATITIS C ANTIBODY
Hepatitis C Ab: NONREACTIVE
SIGNAL TO CUT-OFF: 0.01 (ref <1.00)

## 2019-02-22 LAB — ALPHA-1-ANTITRYPSIN: A-1 Antitrypsin, Ser: 178 mg/dL (ref 83–199)

## 2019-02-22 LAB — HEPATITIS A ANTIBODY, TOTAL: Hepatitis A AB,Total: NONREACTIVE

## 2019-02-22 LAB — HEPATITIS B SURFACE ANTIBODY,QUALITATIVE: Hep B S Ab: REACTIVE — AB

## 2019-02-22 LAB — ANTI-NUCLEAR AB-TITER (ANA TITER): ANA Titer 1: 1:80 {titer} — ABNORMAL HIGH

## 2019-02-22 LAB — ANTI-SMOOTH MUSCLE ANTIBODY, IGG: Actin (Smooth Muscle) Antibody (IGG): <20 U (ref <20)

## 2019-02-22 LAB — CERULOPLASMIN: Ceruloplasmin: 35 mg/dL (ref 18–53)

## 2019-02-22 LAB — HEPATITIS B SURFACE ANTIGEN: Hepatitis B Surface Ag: NONREACTIVE

## 2019-02-22 LAB — ANA: Anti Nuclear Antibody (ANA): POSITIVE — AB

## 2019-02-22 LAB — MITOCHONDRIAL ANTIBODIES: Mitochondrial M2 Ab, IgG: <=20 U

## 2019-02-26 ENCOUNTER — Other Ambulatory Visit: Payer: Self-pay

## 2019-02-26 DIAGNOSIS — R7989 Other specified abnormal findings of blood chemistry: Secondary | ICD-10-CM

## 2019-03-16 ENCOUNTER — Telehealth: Payer: Self-pay

## 2019-03-16 NOTE — Telephone Encounter (Signed)
Called patient and she will come in next week into our lab and get them drawn, Mon-Fri. Between 7:30am-4:30pm

## 2019-03-16 NOTE — Telephone Encounter (Signed)
-----   Message from Hughie Closs, RN sent at 02/26/2019 11:13 AM EDT ----- Call patient to come in for 3-4 week F/U labs = 03/20/19. Order in [place]

## 2019-04-06 ENCOUNTER — Encounter: Payer: No Typology Code available for payment source | Admitting: Gastroenterology

## 2019-05-02 ENCOUNTER — Other Ambulatory Visit: Payer: Self-pay

## 2019-05-02 MED ORDER — OMEPRAZOLE 20 MG PO CPDR: 20.0000 mg | DELAYED_RELEASE_CAPSULE | Freq: Every day | ORAL | 1 refills | Status: DC

## 2019-05-02 MED ORDER — GABAPENTIN 300 MG PO CAPS: ORAL_CAPSULE | ORAL | 2 refills | Status: DC

## 2019-05-02 NOTE — Telephone Encounter (Signed)
Patient seen in October and [facility] requesting 90 day rx for her omeprazole. Sent in as requested.

## 2019-05-02 NOTE — Telephone Encounter (Signed)
Medication refill request: gabapentin 300mg  Last AEX:  10-27-2018 Next AEX: 01-03-2020 Last MMG (if hormonal medication request): n/a Refill authorized: rx was sent 01/2019 for 90 day supply with 4 refills to [facility] pharmacy. Refill [facility] came in from [facility] for 90 day supply. Please approve if appropriate

## 2019-05-08 ENCOUNTER — Other Ambulatory Visit: Payer: Self-pay | Admitting: *Deleted

## 2019-05-08 DIAGNOSIS — D0511 Intraductal carcinoma in situ of right breast: Secondary | ICD-10-CM

## 2019-05-08 MED ORDER — VENLAFAXINE HCL ER 75 MG PO CP24: ORAL_CAPSULE | ORAL | 3 refills | Status: DC

## 2019-05-21 ENCOUNTER — Telehealth: Payer: Self-pay | Admitting: Physician Assistant

## 2019-05-21 NOTE — Telephone Encounter (Signed)
Called patient back and she has called her PCP about her BP being up. Michelle Fernandez it has been going up for a while and that she has been under a lot of stress. She wanted to know if she should cancel her EGD/Colonoscopy for 05/25/19. I asked her to wait and see what her PCP does about her BP and if it can be controlled in the next couple of days before she cancels her procedure. She agreed and will call us back with an update in a couple of days

## 2019-05-22 ENCOUNTER — Telehealth: Payer: Self-pay | Admitting: Physician Assistant

## 2019-05-22 NOTE — Telephone Encounter (Signed)
Patient called to inform you that she spoke with pcp and he is recommending that she holds off on the procedure for a few more weeks due to her high BP.

## 2019-05-25 ENCOUNTER — Encounter: Payer: No Typology Code available for payment source | Admitting: Gastroenterology

## 2019-06-03 NOTE — Progress Notes (Signed)
[place]  Telephone:(336) [phone] Fax:(336) [phone]     ID: PAHOUA SCHREINER DOB: 09/18/1960  MR#: [id number]  VQM#:[id number]  Patient Care Team: Reynold Bowen, MD as PCP - General (Endocrinology) Megan Salon, MD as Consulting Physician (Gynecology) Rolm Bookbinder, MD as Consulting Physician (General Surgery) Ayodeji Keimig, Virgie Dad, MD as Consulting Physician (Oncology) Gery Pray, MD as Consulting Physician (Radiation Oncology) OTHER MD:  CHIEF COMPLAINT: Ductal carcinoma in situ, estrogen receptor positive  CURRENT TREATMENT: Observation   INTERVAL HISTORY: Juel returns today for follow-up of her ductal carcinoma in situ.  At her last visit on 06/01/2018, I suggested she take a 3 month break from the tamoxifen to see if this improved her symptoms.  She went off the tamoxifen and 3 months later she had just as much nausea as before.  Nevertheless she opted against restarting the tamoxifen.  She is taking "too many pills" she feels.  Since her last visit, she underwent bilateral diagnostic mammography with tomography at [facility] on 02/06/2019 showing: breast density category C; 5 mm oval mass in the upper-inner left breast is benign in appearance and stability; no evidence of malignancy in either breast.  She also underwent abdomen ultrasound on 02/01/2019, which showed: probable fatty infiltration of liver; also septated 1.8 cm cyst in right love of liver. She was scheduled for colonoscopy two weeks ago, but this has been delayed by her PCP due to elevated blood pressure.   REVIEW OF SYSTEMS:  Minsa continues to work full-time.  She feels her office is fairly safe.  Family is doing well.  She is working with Dr. Allison Quarry on the fatty liver issue and expecting a colonoscopy sometime this spring.  She is not exercising regularly.  On the plus side she has definitively quit smoking and has not restarted.  A detailed review of systems today was otherwise  stable   HISTORY OF CURRENT ILLNESS: From the original intake note:  DEDRA MATSUO had routine screening mammography at [facility] 01/14/2016 showing an area of grouped calcifications in the central right breast. She was set up for six-month follow-up 07/20/2016 and this showed them to be stable, and probably benign, but repeat 6 month follow-up was recommended and on 01/25/2017 the patient underwent bilateral diagnostic mammography showing the breast density to be category C. In the right breast retroareolar area there was a new group of amorphous calcifications measuring 0.6 cm. Biopsy of this area 02/01/2017 showed ([id number]) ductal carcinoma in situ, intermediate grade, estrogen and progesterone receptor positive.  Right axillary ultrasound 02/01/2017 was sonographically benign  The patient's subsequent history is as detailed below.   PAST MEDICAL HISTORY: Past Medical History:  Diagnosis Date  . Breast cancer ([place])   . Endometriosis   . Genetic testing 02/10/2017   STAT Breast panel with reflex to Common Cancers panel (47 genes) - No pathogenic mutations detected  . Graves disease   . 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 History of radiation therapy 05/17/17-06/13/17   right breast 40.05 Gy in 15 fractions, right breast boost 10 Gy in 5 fractions  . Hyperlipidemia   . Hypertension   . Migraine   . Personal history of asthma   . PONV (postoperative nausea and vomiting)    "really sick when I wake up"    PAST SURGICAL HISTORY: Past Surgical History:  Procedure Laterality Date  . ANTERIOR CRUCIATE LIGAMENT REPAIR Left 1991  . BREAST LUMPECTOMY WITH RADIOACTIVE SEED LOCALIZATION Right 04/04/2017   Procedure: RIGHT BREAST LUMPECTOMY  WITH RADIOACTIVE SEED LOCALIZATION;  Surgeon: Rolm Bookbinder, MD;  Location: [facility];  Service: General;  Laterality: Right;  . CHOLECYSTECTOMY, LAPAROSCOPIC  2001  . INCISIONAL HERNIA REPAIR  2001,2019   from lap chole  . MENISCUS REPAIR Left 1993  . ULNAR  TUNNEL RELEASE Right   . VAGINAL HYSTERECTOMY  1988   for endometriosos    FAMILY HISTORY Family History  Problem Relation Age of Onset  . Breast cancer Mother 35       TAH/BSO 63s; currently 78  . Diabetes Mellitus I Mother   . Lung cancer Mother 38  . Dementia Mother   . Heart attack Father   . Breast cancer Maternal Aunt 62       currently 29s  . Multiple myeloma Sister 11  . Cancer Maternal Uncle        unk. type; deceased 51s  . Cancer Cousin        son of mat uncle with cancer; unk. type; deceased 40s  Her father died at age 20 from a MI. Her mother is 83 years old as of October 2018. The patient's mother has a history of lung cancer diagnosed at age 35 and breast cancer diagnosed at age 60. Pt has 3 sisters. One sister has myeloma. Her maternal aunt was diagnosed at age 51 with breast cancer.   GYNECOLOGIC HISTORY:  Patient's last menstrual period was 05/03/1986. Menarche: 59 years old Age at first live birth: 59 years old GP: GXP2 LMP: At age 10 due to a partial hysterectomy (no salpingo-oophorectomy). 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 She lives at home with her Husband, Louie Casa, who is a Administrator. She has an outside dog. Daughter Dietrich Pates lives in [place] and works as a Theme park manager. Daughter Conesville lives in [place] and is a "stay at home mom". The patient has 6 grandchildren, all boys.    ADVANCED DIRECTIVES: Yes, her husband is her healthcare POA.    HEALTH MAINTENANCE: Social History   Tobacco Use  . Smoking status: Former Smoker    Types: Cigarettes    Quit date: 05/05/2006    Years since quitting: 13.0  . Smokeless tobacco: Never Used  Substance Use Topics  . Alcohol use: No  . Drug use: No     Colonoscopy: No  PAP: Last on 03/18/2015 and negative.  Bone density: No   Allergies  Allergen Reactions  . Phenergan [Promethazine Hcl] Nausea Only    Current Outpatient Medications   Medication Sig Dispense Refill  . gabapentin (NEURONTIN) 300 MG capsule Take 1 po at hs 90 capsule 2  . KLOR-CON M20 20 MEQ tablet Take 20 mEq by mouth daily.  5  . metoCLOPramide (REGLAN) 10 MG tablet Take 1 tablet (10 mg total) by mouth as directed. Take one before each prep 2 tablet 0  . metoprolol succinate (TOPROL-XL) 25 MG 24 hr tablet Take 12.5 mg daily by mouth.     . Na Sulfate-K Sulfate-Mg Sulf 17.5-3.13-1.6 GM/177ML SOLN Suprep-Use as directed 354 mL 0  . omeprazole (PRILOSEC) 20 MG capsule Take 1 capsule (20 mg total) by mouth daily before breakfast. Take 30-60 minutes before breakfast 90 capsule 1  . rosuvastatin (CRESTOR) 10 MG tablet Take 10 mg daily by mouth.     . SYNTHROID 125 MCG tablet Take 125 mcg by mouth daily.  6  . venlafaxine XR (EFFEXOR-XR) 75 MG 24 hr capsule TAKE 1 CAPSULE DAILY  WITH BREAKFAST 90 capsule 3  . ZETIA 10 MG tablet Take 10 mg daily by mouth.      No current facility-administered medications for this visit.    OBJECTIVE: Middle-aged white woman who appears stated age  59:   06/04/19 1351  BP: (!) 156/68  Pulse: 73  Resp: 18  Temp: 98.2 F (36.8 C)  SpO2: 100%     Body mass index is 29.18 kg/m.   Wt Readings from Last 3 Encounters:  06/04/19 164 lb 11.2 oz (74.7 kg)  02/15/19 163 lb (73.9 kg)  10/27/18 164 lb (74.4 kg)      ECOG FS:1 - Symptomatic but completely ambulatory  Sclerae unicteric, EOMs intact Wearing a mask No cervical or supraclavicular adenopathy Lungs no rales or rhonchi Heart regular rate and rhythm Abd soft, nontender, positive bowel sounds, no masses palpated MSK no focal spinal tenderness, no upper extremity lymphedema Neuro: nonfocal, well oriented, appropriate affect Breasts: The right breast has undergone lumpectomy followed by radiation.  There is no evidence of disease recurrence.  Left breast is unremarkable.  Both axillae are benign.  LAB RESULTS:  CMP     Component Value Date/Time   NA 141  06/04/2019 1334   NA 142 02/09/2017 0846   K 3.7 06/04/2019 1334   K 3.3 (L) 02/09/2017 0846   CL 104 06/04/2019 1334   CO2 28 06/04/2019 1334   CO2 27 02/09/2017 0846   GLUCOSE 95 06/04/2019 1334   GLUCOSE 87 02/09/2017 0846   BUN 7 06/04/2019 1334   BUN 6.8 (L) 02/09/2017 0846   CREATININE 0.67 06/04/2019 1334   CREATININE 0.7 02/09/2017 0846   CALCIUM 9.5 06/04/2019 1334   CALCIUM 10.2 02/09/2017 0846   PROT 7.5 06/04/2019 1334   PROT 7.6 02/09/2017 0846   ALBUMIN 4.3 06/04/2019 1334   ALBUMIN 4.3 02/09/2017 0846   AST 88 (H) 06/04/2019 1334   AST 21 02/09/2017 0846   ALT 87 (H) 06/04/2019 1334   ALT 25 02/09/2017 0846   ALKPHOS 117 06/04/2019 1334   ALKPHOS 58 02/09/2017 0846   BILITOT 0.3 06/04/2019 1334   BILITOT 0.53 02/09/2017 0846   GFRNONAA >60 06/04/2019 1334   GFRAA >60 06/04/2019 1334    No results found for: TOTALPROTELP, ALBUMINELP, A1GS, A2GS, BETS, BETA2SER, GAMS, MSPIKE, SPEI  No results found for: KPAFRELGTCHN, LAMBDASER, KAPLAMBRATIO  Lab Results  Component Value Date   WBC 6.4 06/04/2019   NEUTROABS 3.5 06/04/2019   HGB 14.9 06/04/2019   HCT 45.5 06/04/2019   MCV 90.6 06/04/2019   PLT 143 (L) 06/04/2019   No results found for: LABCA2  No components found for: QBHALP379  No results for input(s): INR in the last 168 hours.  No results found for: LABCA2  No results found for: KWI097  No results found for: DZH299  No results found for: MEQ683  No results found for: CA2729  No components found for: HGQUANT  No results found for: CEA1 / No results found for: CEA1   No results found for: AFPTUMOR  No results found for: CHROMOGRNA  No results found for: HGBA, HGBA2QUANT, HGBFQUANT, HGBSQUAN (Hemoglobinopathy evaluation)   No results found for: LDH  Lab Results  Component Value Date   IRON 112 02/15/2019   IRONPCTSAT 28.5 02/15/2019   (Iron and TIBC)  Lab Results  Component Value Date   FERRITIN 140.5 02/15/2019     Urinalysis    Component Value Date/Time   BILIRUBINUR N 03/17/2018 1513   PROTEINUR  Negative 03/17/2018 1513   UROBILINOGEN 0.2 03/17/2018 1513   NITRITE N 03/17/2018 1513   LEUKOCYTESUR Negative 03/17/2018 1513     STUDIES: No results found.   ELIGIBLE FOR AVAILABLE RESEARCH PROTOCOL: COMET, declined  ASSESSMENT: 59 y.o. [place] woman Status post right breast retroareolar biopsy 02/01/2017 for ductal carcinoma in situ grade 2, estrogen and progesterone receptor positive  (1) genetics testing through the STAT Breast panel with reflex to  [facility]'sCommon Cancers panel (47 genes) found no pathogenic mutations in APC, ATM, AXIN2, BARD1, BMPR1A, BRCA1, BRCA2, BRIP1, CDH1, CDK4, CDKN2A, CHEK2, CTNNA1, DICER1, EPCAM, GREM1, HOXB13, KIT, MEN1, MLH1, MSH2, MSH3, MSH6, MUTYH, NBN, NF1, NTHL1, PALB2, PDGFRA, PMS2, POLD1, POLE, PTEN, RAD50, RAD51C, RAD51D, SDHA, SDHB, SDHC, SDHD, SMAD4, SMARCA4, STK11, TP53, TSC1, TSC2, VHL.   (2) post right lumpectomy 04/04/2017 showing only residual atypical ductal hyperplasia  (3) adjuvant radiation 05/17/17-06/13/17: 40.05 Gy to the right breast with 10 Gy boost  (4) tamoxifen started 08/17/2017.  (5) intermittent tobacco abuse: Has definitively quit smoking as of September 2018.  She says she has a 5 pack year cumulative tobacco history. (10 years all together with 1/2ppd)   PLAN: Bryley is now a little over 2 years out from definitive surgery for her breast cancer with no evidence of disease recurrence.  This is very favorable.  Because her cancer was not invasive it is not life-threatening for her not to take antiestrogens.  It just means she is accepting a higher risk of local recurrence, which is not going to be high, and more importantly a higher risk of developing another breast cancer in either breast in the future.    I have encouraged her to exercise on a regular basis.  I commended her continuing to not smoke.  She is working on her  colonoscopy high blood pressure and hepatic steatosis through Dr. Forde Dandy and through her GI doctor.  She will see me again in a year.  She knows to call for any other issue that may develop before the next visit.  Total encounter time 20 minutes.Sarajane Jews C. Hung Rhinesmith, MD 06/04/19 4:27 PM Medical Oncology and Hematology [facility] [place], [place] [address] Tel. [phone]    Fax. [phone]   I, Wilburn Mylar, am acting as scribe for Dr. Virgie Dad. Shakeel Disney.  I, Lurline Del MD, have reviewed the above documentation for accuracy and completeness, and I agree with the above.   *Total Encounter Time as defined by the Centers for Medicare and Medicaid Services includes, in addition to the face-to-face time of a patient visit (documented in the note above) non-face-to-face time: obtaining and reviewing outside history, ordering and reviewing medications, tests or procedures, care coordination (communications with other health care professionals or caregivers) and documentation in the medical record.

## 2019-06-04 ENCOUNTER — Other Ambulatory Visit: Payer: Self-pay | Admitting: *Deleted

## 2019-06-04 ENCOUNTER — Inpatient Hospital Stay: Payer: No Typology Code available for payment source

## 2019-06-04 ENCOUNTER — Other Ambulatory Visit: Payer: Self-pay

## 2019-06-04 ENCOUNTER — Inpatient Hospital Stay: Payer: No Typology Code available for payment source | Attending: Oncology | Admitting: Oncology

## 2019-06-04 VITALS — BP 156/68 | HR 73 | Temp 98.2°F | Resp 18 | Ht 63.0 in | Wt 164.7 lb

## 2019-06-04 DIAGNOSIS — I10 Essential (primary) hypertension: Secondary | ICD-10-CM | POA: Diagnosis not present

## 2019-06-04 DIAGNOSIS — Z923 Personal history of irradiation: Secondary | ICD-10-CM | POA: Diagnosis not present

## 2019-06-04 DIAGNOSIS — Z833 Family history of diabetes mellitus: Secondary | ICD-10-CM | POA: Diagnosis not present

## 2019-06-04 DIAGNOSIS — K760 Fatty (change of) liver, not elsewhere classified: Secondary | ICD-10-CM

## 2019-06-04 DIAGNOSIS — Z170 Estrogen receptor positive status [ER+]: Secondary | ICD-10-CM | POA: Insufficient documentation

## 2019-06-04 DIAGNOSIS — Z803 Family history of malignant neoplasm of breast: Secondary | ICD-10-CM | POA: Insufficient documentation

## 2019-06-04 DIAGNOSIS — Z1401 Asymptomatic hemophilia A carrier: Secondary | ICD-10-CM

## 2019-06-04 DIAGNOSIS — J45909 Unspecified asthma, uncomplicated: Secondary | ICD-10-CM | POA: Insufficient documentation

## 2019-06-04 DIAGNOSIS — Z8249 Family history of ischemic heart disease and other diseases of the circulatory system: Secondary | ICD-10-CM | POA: Diagnosis not present

## 2019-06-04 DIAGNOSIS — Z809 Family history of malignant neoplasm, unspecified: Secondary | ICD-10-CM | POA: Insufficient documentation

## 2019-06-04 DIAGNOSIS — Z79899 Other long term (current) drug therapy: Secondary | ICD-10-CM | POA: Insufficient documentation

## 2019-06-04 DIAGNOSIS — Z79810 Long term (current) use of selective estrogen receptor modulators (SERMs): Secondary | ICD-10-CM | POA: Diagnosis not present

## 2019-06-04 DIAGNOSIS — Z801 Family history of malignant neoplasm of trachea, bronchus and lung: Secondary | ICD-10-CM | POA: Insufficient documentation

## 2019-06-04 DIAGNOSIS — Z1379 Encounter for other screening for genetic and chromosomal anomalies: Secondary | ICD-10-CM

## 2019-06-04 DIAGNOSIS — D0511 Intraductal carcinoma in situ of right breast: Secondary | ICD-10-CM

## 2019-06-04 DIAGNOSIS — Z87891 Personal history of nicotine dependence: Secondary | ICD-10-CM | POA: Insufficient documentation

## 2019-06-04 DIAGNOSIS — E785 Hyperlipidemia, unspecified: Secondary | ICD-10-CM | POA: Diagnosis not present

## 2019-06-04 DIAGNOSIS — Z807 Family history of other malignant neoplasms of lymphoid, hematopoietic and related tissues: Secondary | ICD-10-CM | POA: Insufficient documentation

## 2019-06-04 DIAGNOSIS — Z90710 Acquired absence of both cervix and uterus: Secondary | ICD-10-CM

## 2019-06-04 LAB — CMP (CANCER CENTER ONLY)
ALT: 87 U/L — ABNORMAL HIGH (ref 0–44)
AST: 88 U/L — ABNORMAL HIGH (ref 15–41)
Albumin: 4.3 g/dL (ref 3.5–5.0)
Alkaline Phosphatase: 117 U/L (ref 38–126)
Anion gap: 9 (ref 5–15)
BUN: 7 mg/dL (ref 6–20)
CO2: 28 mmol/L (ref 22–32)
Calcium: 9.5 mg/dL (ref 8.9–10.3)
Chloride: 104 mmol/L (ref 98–111)
Creatinine: 0.67 mg/dL (ref 0.44–1.00)
GFR, Est AFR Am: >60 mL/min (ref >60)
GFR, Estimated: >60 mL/min (ref >60)
Glucose, Bld: 95 mg/dL (ref 70–99)
Potassium: 3.7 mmol/L (ref 3.5–5.1)
Sodium: 141 mmol/L (ref 135–145)
Total Bilirubin: 0.3 mg/dL (ref 0.3–1.2)
Total Protein: 7.5 g/dL (ref 6.5–8.1)

## 2019-06-04 LAB — CBC WITH DIFFERENTIAL (CANCER CENTER ONLY)
Abs Immature Granulocytes: 0.01 10*3/uL (ref 0.00–0.07)
Basophils Absolute: 0.1 10*3/uL (ref 0.0–0.1)
Basophils Relative: 1 %
Eosinophils Absolute: 0.1 10*3/uL (ref 0.0–0.5)
Eosinophils Relative: 2 %
HCT: 45.5 % (ref 36.0–46.0)
Hemoglobin: 14.9 g/dL (ref 12.0–15.0)
Immature Granulocytes: 0 %
Lymphocytes Relative: 35 %
Lymphs Abs: 2.2 10*3/uL (ref 0.7–4.0)
MCH: 29.7 pg (ref 26.0–34.0)
MCHC: 32.7 g/dL (ref 30.0–36.0)
MCV: 90.6 fL (ref 80.0–100.0)
Monocytes Absolute: 0.5 10*3/uL (ref 0.1–1.0)
Monocytes Relative: 7 %
Neutro Abs: 3.5 10*3/uL (ref 1.7–7.7)
Neutrophils Relative %: 55 %
Platelet Count: 143 10*3/uL — ABNORMAL LOW (ref 150–400)
RBC: 5.02 MIL/uL (ref 3.87–5.11)
RDW: 12.3 % (ref 11.5–15.5)
WBC Count: 6.4 10*3/uL (ref 4.0–10.5)
nRBC: 0 % (ref 0.0–0.2)

## 2019-06-05 ENCOUNTER — Telehealth: Payer: Self-pay | Admitting: Oncology

## 2019-06-05 NOTE — Telephone Encounter (Signed)
I talk with patient regarding schedule  

## 2019-11-06 ENCOUNTER — Other Ambulatory Visit: Payer: Self-pay | Admitting: Physician Assistant

## 2019-11-29 IMAGING — US US ABDOMEN COMPLETE
1 series · 14 of 25 positions shown · non-contrast
Comparison: Partial comparison CT abdomen 11/16/2004

CLINICAL DATA: Transaminitis, elevated serum transaminases, history
hypertension, DCIS RIGHT breast

EXAM:
ABDOMEN ULTRASOUND COMPLETE

[Series 1: us abdomen complete · 0.33mm/px · 14 of 116 slices shown]
[im 1/116]
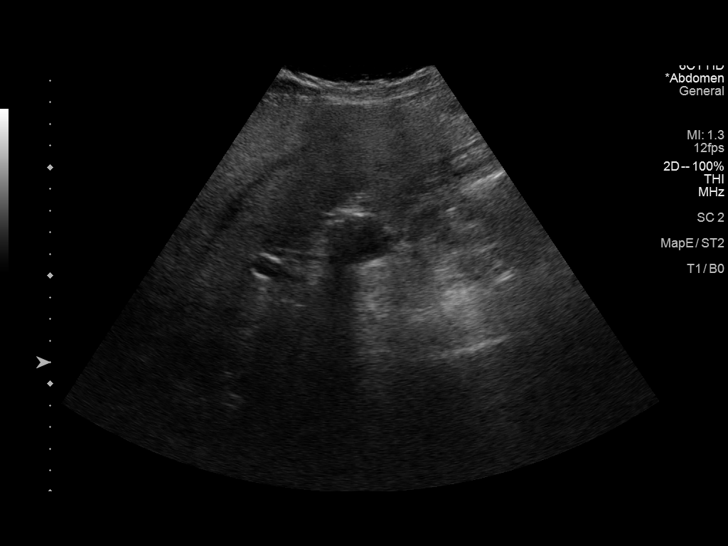
[im 10/116]
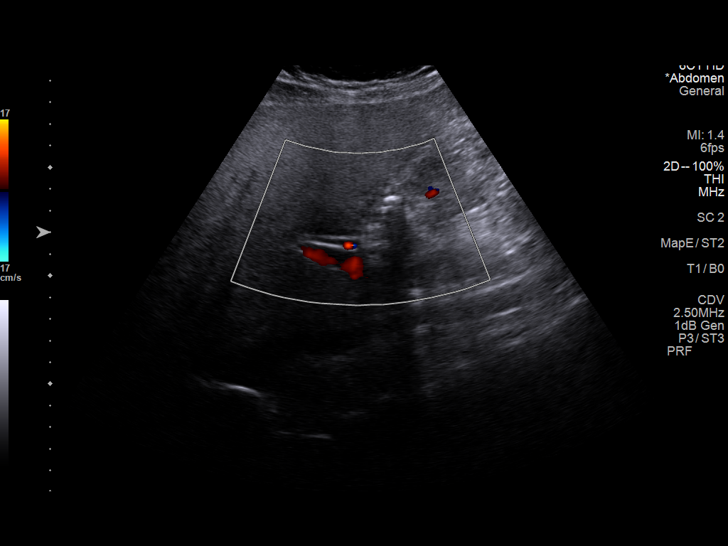
[im 20/116]
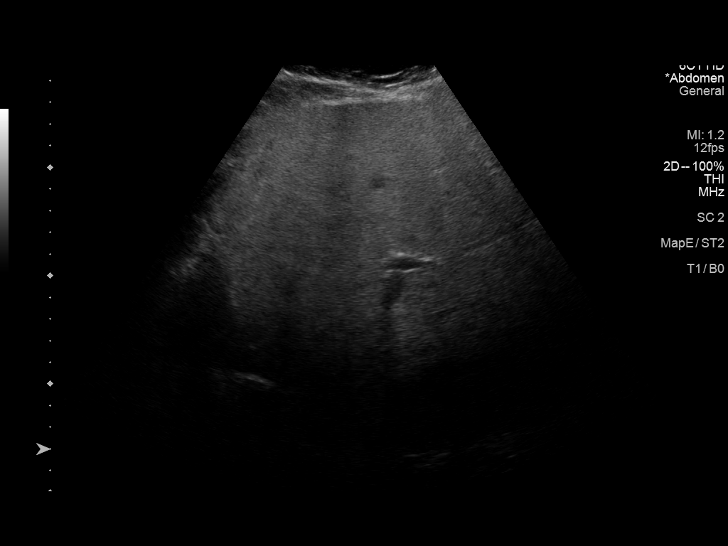
[im 29/116]
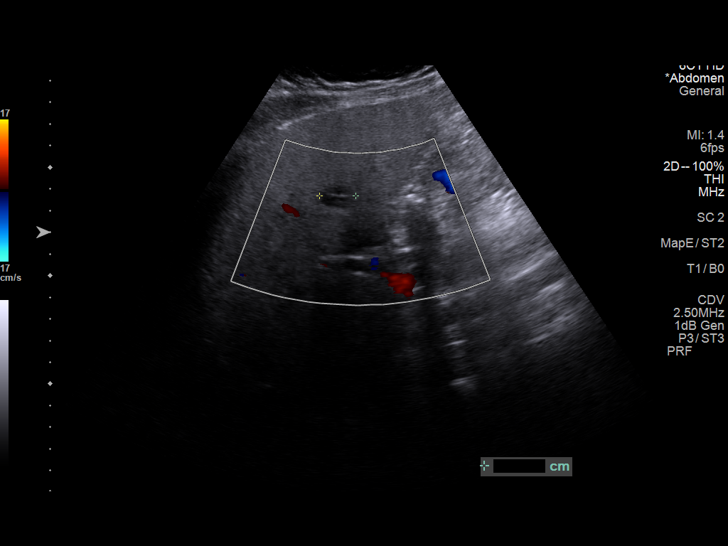
[im 39/116]
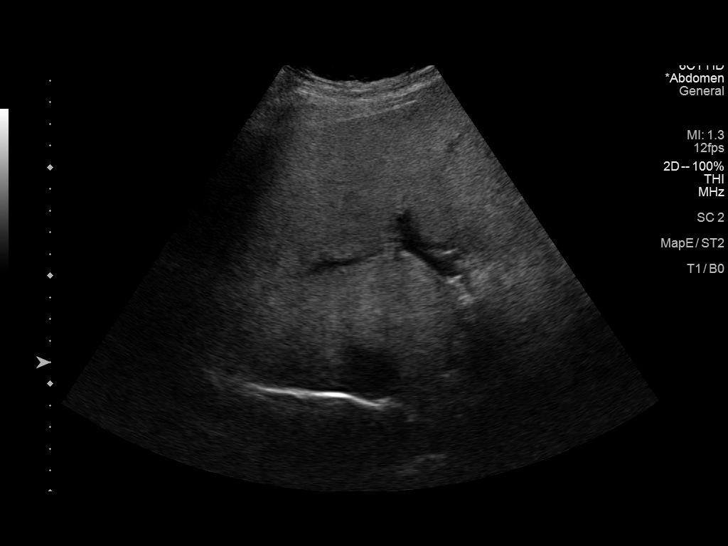
[im 44/116]
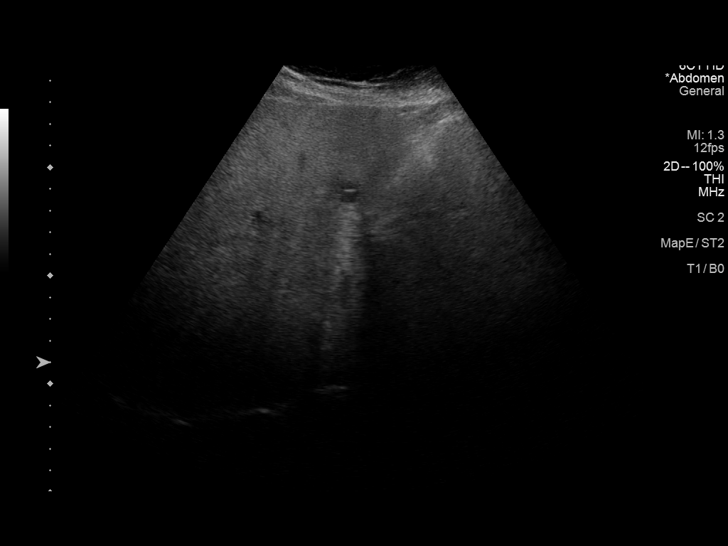
[im 53/116]
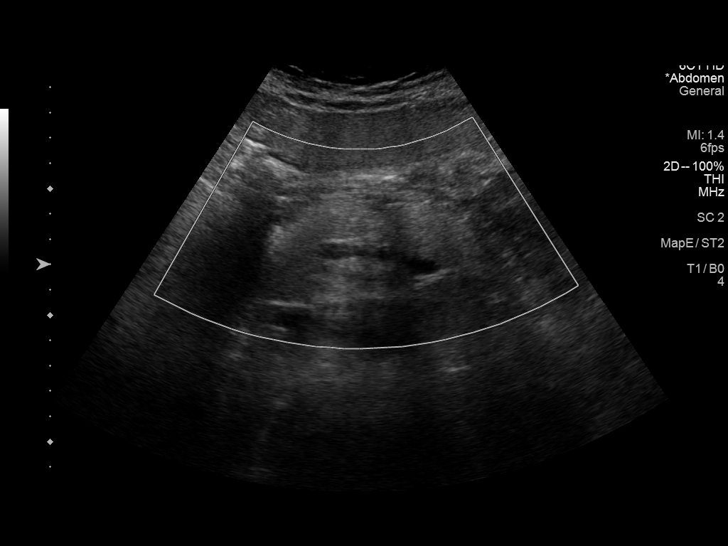
[im 63/116]
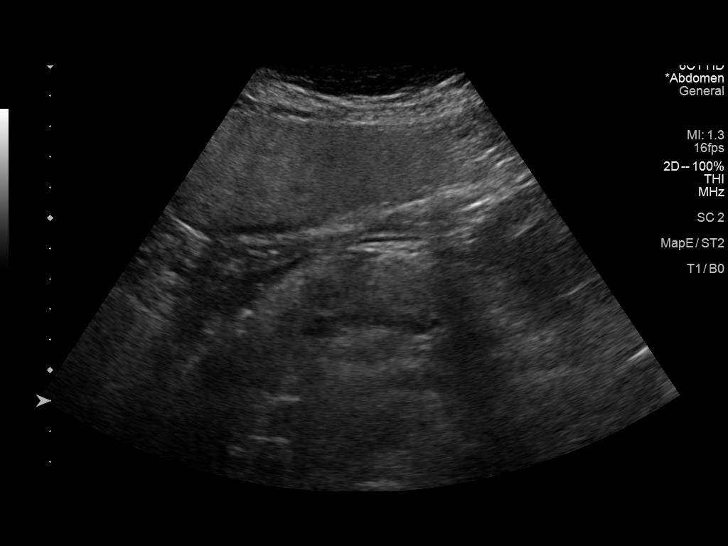
[im 72/116]
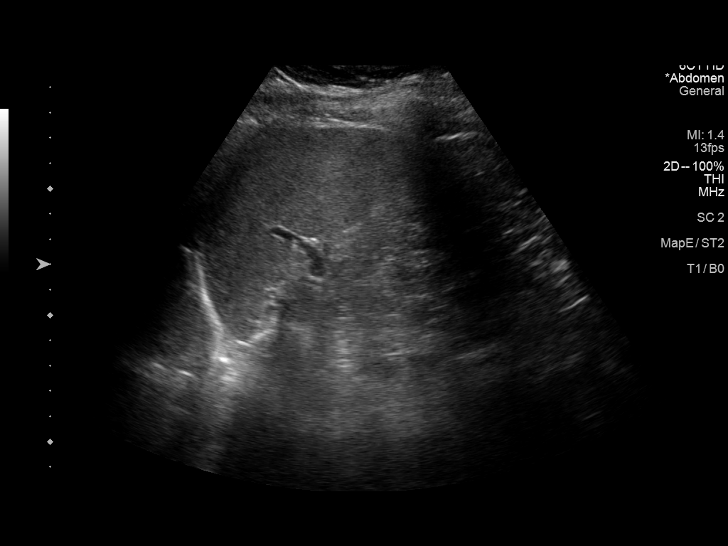
[im 77/116]
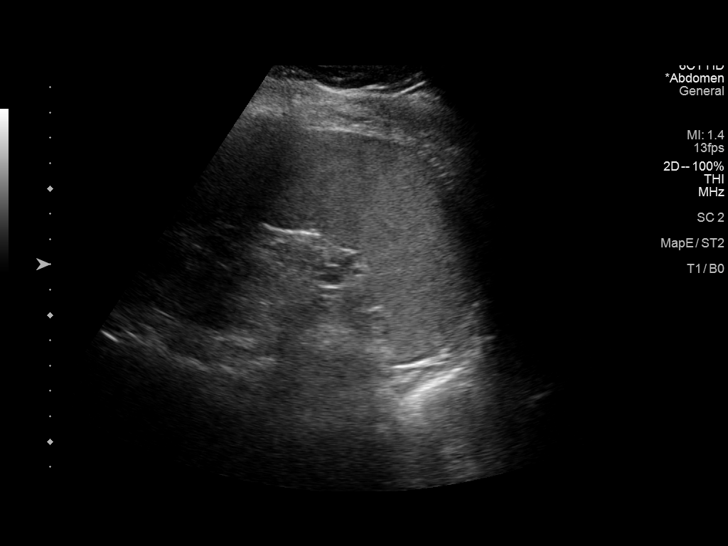
[im 87/116]
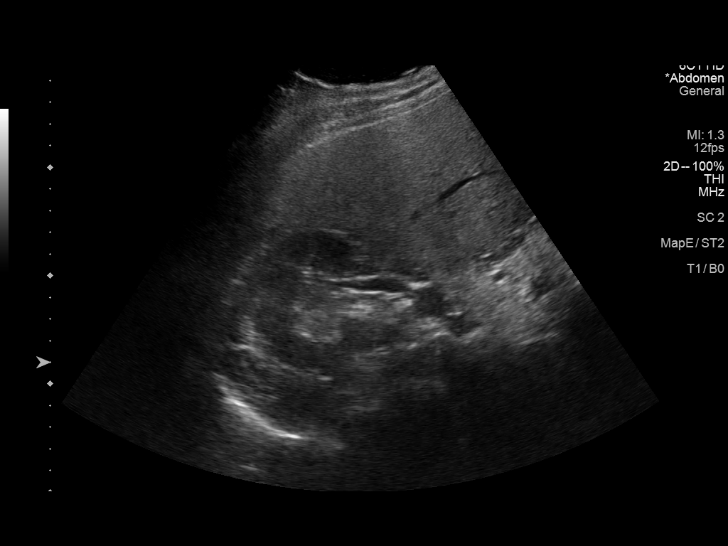
[im 96/116]
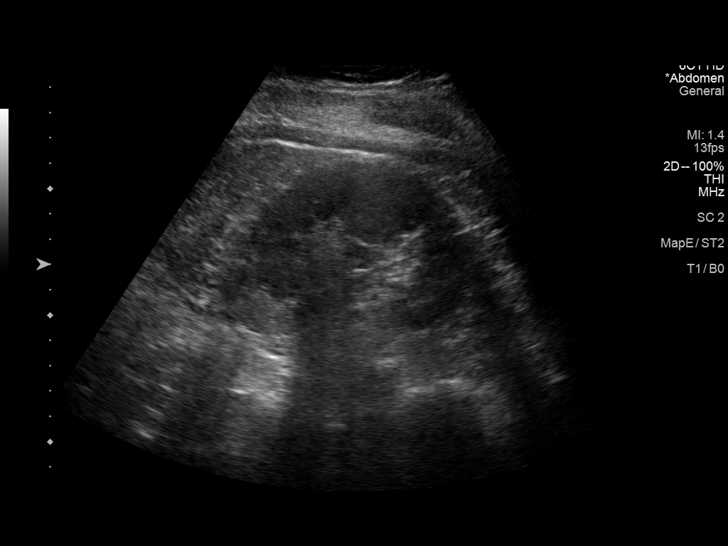
[im 106/116]
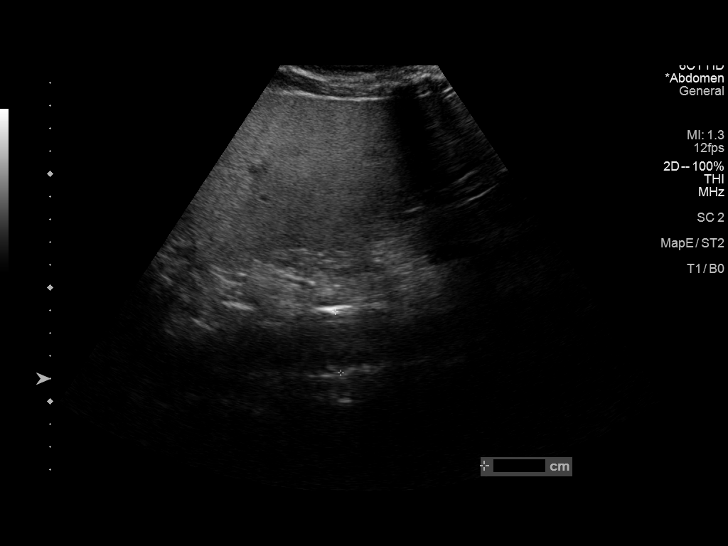
[im 116/116]
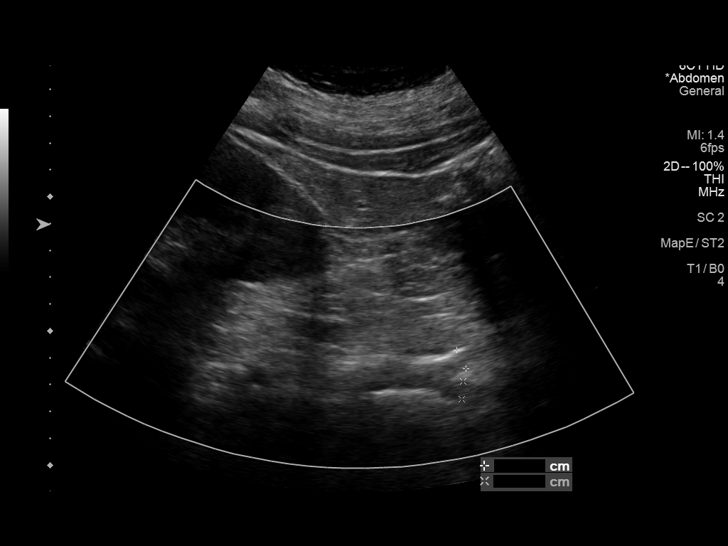

[14 of 25 positions shown; findings below may reference images not displayed]

FINDINGS: Gallbladder: Surgically absent

Common bile duct: Diameter: 3 mm, normal

Liver: Echogenic parenchyma, likely fatty infiltration though this
can be seen with cirrhosis and certain infiltrative disorders.
Septated cyst within RIGHT lobe liver 18 x 12 x 17 mm. No additional
hepatic mass or nodularity. Portal vein is patent on color Doppler
imaging with normal direction of blood flow towards the liver.

IVC: Normal appearance

Pancreas: Normal appearance

Spleen: Normal appearance, 7.5 cm length

Right Kidney: Length: 11.1. Normal morphology without mass or
hydronephrosis.

Left Kidney: Length: 11.7. Normal morphology without mass or
hydronephrosis.

Abdominal aorta: Normal caliber.

Other findings: No free fluid.
IMPRESSION: Probable fatty infiltration of liver.

Septated cyst RIGHT lobe liver 1.8 cm greatest size.

Post cholecystectomy.

Remainder of exam unremarkable.

## 2019-12-31 ENCOUNTER — Telehealth: Payer: Self-pay

## 2019-12-31 NOTE — Telephone Encounter (Signed)
Spoke with patient.  AEX r/s to 02/08/20 at 10am.  Patient declined earlier appts offered.  Placed on wait list.  Patient is agreeable to date and time.  Encounter closed.

## 2019-12-31 NOTE — Progress Notes (Deleted)
59 y.o. G2P2 Married White or Caucasian female here for annual exam.    Patient's last menstrual period was 05/03/1986.          Sexually active: {yes no:314532}  The current method of family planning is status post hysterectomy.    Exercising: {yes no:314532}  {types:19826} Smoker:  {YES NO:22349}  Health Maintenance: Pap:  2016 neg History of abnormal Pap:  no MMG:  02-06-2019 category c density birads 2:neg Colonoscopy:  none BMD:   none TDaP:  unsure Pneumonia vaccine(s):  *** Shingrix:   *** Hep C testing: neg 2017 Screening Labs: ***   reports that she quit smoking about 13 years ago. Her smoking use included cigarettes. She has never used smokeless tobacco. She reports that she does not drink alcohol and does not use drugs.  Past Medical History:  Diagnosis Date  . Breast cancer ([place])   . Endometriosis   . Genetic testing 02/10/2017   STAT Breast panel with reflex to Common Cancers panel (47 genes) - No pathogenic mutations detected  . Graves disease   . 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 History of radiation therapy 05/17/17-06/13/17   right breast 40.05 Gy in 15 fractions, right breast boost 10 Gy in 5 fractions  . Hyperlipidemia   . Hypertension   . Migraine   . Personal history of asthma   . PONV (postoperative nausea and vomiting)    "really sick when I wake up"    Past Surgical History:  Procedure Laterality Date  . ANTERIOR CRUCIATE LIGAMENT REPAIR Left 1991  . BREAST LUMPECTOMY WITH RADIOACTIVE SEED LOCALIZATION Right 04/04/2017   Procedure: RIGHT BREAST LUMPECTOMY WITH RADIOACTIVE SEED LOCALIZATION;  Surgeon: Rolm Bookbinder, MD;  Location: [facility];  Service: General;  Laterality: Right;  . CHOLECYSTECTOMY, LAPAROSCOPIC  2001  . INCISIONAL HERNIA REPAIR  2001,2019   from lap chole  . MENISCUS REPAIR Left 1993  . ULNAR TUNNEL RELEASE Right   . VAGINAL HYSTERECTOMY  1988   for endometriosos    Current Outpatient Medications  Medication Sig Dispense Refill  .  gabapentin (NEURONTIN) 300 MG capsule Take 1 po at hs 90 capsule 2  . KLOR-CON M20 20 MEQ tablet Take 20 mEq by mouth daily.  5  . metoCLOPramide (REGLAN) 10 MG tablet Take 1 tablet (10 mg total) by mouth as directed. Take one before each prep 2 tablet 0  . metoprolol succinate (TOPROL-XL) 25 MG 24 hr tablet Take 12.5 mg daily by mouth.     . Na Sulfate-K Sulfate-Mg Sulf 17.5-3.13-1.6 GM/177ML SOLN Suprep-Use as directed 354 mL 0  . omeprazole (PRILOSEC) 20 MG capsule TAKE 1 CAPSULE DAILY 30 TO 60 MINUTES BEFORE BREAKFAST 90 capsule 3  . rosuvastatin (CRESTOR) 10 MG tablet Take 10 mg daily by mouth.     . SYNTHROID 125 MCG tablet Take 125 mcg by mouth daily.  6  . venlafaxine XR (EFFEXOR-XR) 75 MG 24 hr capsule TAKE 1 CAPSULE DAILY WITH BREAKFAST 90 capsule 3  . ZETIA 10 MG tablet Take 10 mg daily by mouth.      No current facility-administered medications for this visit.    Family History  Problem Relation Age of Onset  . Breast cancer Mother 59       TAH/BSO 29s; currently 30  . Diabetes Mellitus I Mother   . Lung cancer Mother 5  . Dementia Mother   . Heart attack Father   . Breast cancer Maternal Aunt 74  currently 25s  . Multiple myeloma Sister 59  . Cancer Maternal Uncle        unk. type; deceased 59s  . Cancer Cousin        son of mat uncle with cancer; unk. type; deceased 6s    Review of Systems  Exam:   LMP 05/03/1986      General appearance: alert, cooperative and appears stated age Head: Normocephalic, without obvious abnormality, atraumatic Neck: no adenopathy, supple, symmetrical, trachea midline and thyroid {EXAM; THYROID:18604} Lungs: clear to auscultation bilaterally Breasts: {Exam; breast:13139::"normal appearance, no masses or tenderness"} Heart: regular rate and rhythm Abdomen: soft, non-tender; bowel sounds normal; no masses,  no organomegaly Extremities: extremities normal, atraumatic, no cyanosis or edema Skin: Skin color, texture, turgor  normal. No rashes or lesions Lymph nodes: Cervical, supraclavicular, and axillary nodes normal. No abnormal inguinal nodes palpated Neurologic: Grossly normal   Pelvic: External genitalia:  no lesions              Urethra:  normal appearing urethra with no masses, tenderness or lesions              Bartholins and Skenes: normal                 Vagina: normal appearing vagina with normal color and discharge, no lesions              Cervix: {exam; cervix:14595}              Pap taken: {yes no:314532} Bimanual Exam:  Uterus:  {exam; uterus:12215}              Adnexa: {exam; adnexa:12223}               Rectovaginal: Confirms               Anus:  normal sphincter tone, no lesions  Chaperone, ***Terence Lux, CMA, was present for exam.  A:  Well Woman with normal exam  P:   {plan; gyn:5269::"mammogram","pap smear","return annually or prn"}

## 2019-12-31 NOTE — Telephone Encounter (Signed)
Patients AEX was cancelled due to our office. Patient needs AEX rescheduled. No available appointments, need triage to assist.

## 2020-01-03 ENCOUNTER — Ambulatory Visit: Payer: PRIVATE HEALTH INSURANCE | Admitting: Obstetrics & Gynecology

## 2020-01-28 ENCOUNTER — Encounter: Payer: Self-pay | Admitting: Oncology

## 2020-01-28 DIAGNOSIS — Z1231 Encounter for screening mammogram for malignant neoplasm of breast: Secondary | ICD-10-CM

## 2020-02-05 ENCOUNTER — Other Ambulatory Visit: Payer: Self-pay | Admitting: *Deleted

## 2020-02-05 ENCOUNTER — Telehealth: Payer: Self-pay | Admitting: *Deleted

## 2020-02-05 ENCOUNTER — Other Ambulatory Visit: Payer: Self-pay | Admitting: Oncology

## 2020-02-05 DIAGNOSIS — Z853 Personal history of malignant neoplasm of breast: Secondary | ICD-10-CM

## 2020-02-05 NOTE — Telephone Encounter (Signed)
Received call from [facility] stating that they had Epic order for annual diagnostic mammogram but pt usually gets at [facility].  She asked that order be sent to them. Order faxed.

## 2020-02-05 NOTE — Progress Notes (Deleted)
59 y.o. G2P2 Married White or Caucasian female here for annual exam.    Patient's last menstrual period was 05/03/1986.          Sexually active: {yes no:314532}  The current method of family planning is status post hysterectomy.    Exercising: {yes no:314532}  {types:19826} Smoker:  {YES NO:22349}  Health Maintenance: Pap:  2016 neg History of abnormal Pap:  no MMG:  02-06-2019 category c density birads 2:neg Colonoscopy:  none BMD:   none TDaP:  unsure Pneumonia vaccine(s):  *** Shingrix:   *** Hep C testing: neg 2020 Screening Labs: ***   reports that she quit smoking about 13 years ago. Her smoking use included cigarettes. She has never used smokeless tobacco. She reports that she does not drink alcohol and does not use drugs.  Past Medical History:  Diagnosis Date  . Breast cancer ([place])   . Endometriosis   . Genetic testing 02/10/2017   STAT Breast panel with reflex to Common Cancers panel (47 genes) - No pathogenic mutations detected  . Graves disease   . 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 History of radiation therapy 05/17/17-06/13/17   right breast 40.05 Gy in 15 fractions, right breast boost 10 Gy in 5 fractions  . Hyperlipidemia   . Hypertension   . Migraine   . Personal history of asthma   . PONV (postoperative nausea and vomiting)    "really sick when I wake up"    Past Surgical History:  Procedure Laterality Date  . ANTERIOR CRUCIATE LIGAMENT REPAIR Left 1991  . BREAST LUMPECTOMY WITH RADIOACTIVE SEED LOCALIZATION Right 04/04/2017   Procedure: RIGHT BREAST LUMPECTOMY WITH RADIOACTIVE SEED LOCALIZATION;  Surgeon: Rolm Bookbinder, MD;  Location: [facility];  Service: General;  Laterality: Right;  . CHOLECYSTECTOMY, LAPAROSCOPIC  2001  . INCISIONAL HERNIA REPAIR  2001,2019   from lap chole  . MENISCUS REPAIR Left 1993  . ULNAR TUNNEL RELEASE Right   . VAGINAL HYSTERECTOMY  1988   for endometriosos    Current Outpatient Medications  Medication Sig Dispense Refill  .  gabapentin (NEURONTIN) 300 MG capsule Take 1 po at hs 90 capsule 2  . KLOR-CON M20 20 MEQ tablet Take 20 mEq by mouth daily.  5  . metoCLOPramide (REGLAN) 10 MG tablet Take 1 tablet (10 mg total) by mouth as directed. Take one before each prep 2 tablet 0  . metoprolol succinate (TOPROL-XL) 25 MG 24 hr tablet Take 12.5 mg daily by mouth.     . Na Sulfate-K Sulfate-Mg Sulf 17.5-3.13-1.6 GM/177ML SOLN Suprep-Use as directed 354 mL 0  . omeprazole (PRILOSEC) 20 MG capsule TAKE 1 CAPSULE DAILY 30 TO 60 MINUTES BEFORE BREAKFAST 90 capsule 3  . rosuvastatin (CRESTOR) 10 MG tablet Take 10 mg daily by mouth.     . SYNTHROID 125 MCG tablet Take 125 mcg by mouth daily.  6  . venlafaxine XR (EFFEXOR-XR) 75 MG 24 hr capsule TAKE 1 CAPSULE DAILY WITH BREAKFAST 90 capsule 3  . ZETIA 10 MG tablet Take 10 mg daily by mouth.      No current facility-administered medications for this visit.    Family History  Problem Relation Age of Onset  . Breast cancer Mother 77       TAH/BSO 63s; currently 28  . Diabetes Mellitus I Mother   . Lung cancer Mother 62  . Dementia Mother   . Heart attack Father   . Breast cancer Maternal Aunt 63  currently 25s  . Multiple myeloma Sister 59  . Cancer Maternal Uncle        unk. type; deceased 59s  . Cancer Cousin        son of mat uncle with cancer; unk. type; deceased 6s    Review of Systems  Exam:   LMP 05/03/1986      General appearance: alert, cooperative and appears stated age Head: Normocephalic, without obvious abnormality, atraumatic Neck: no adenopathy, supple, symmetrical, trachea midline and thyroid {EXAM; THYROID:18604} Lungs: clear to auscultation bilaterally Breasts: {Exam; breast:13139::"normal appearance, no masses or tenderness"} Heart: regular rate and rhythm Abdomen: soft, non-tender; bowel sounds normal; no masses,  no organomegaly Extremities: extremities normal, atraumatic, no cyanosis or edema Skin: Skin color, texture, turgor  normal. No rashes or lesions Lymph nodes: Cervical, supraclavicular, and axillary nodes normal. No abnormal inguinal nodes palpated Neurologic: Grossly normal   Pelvic: External genitalia:  no lesions              Urethra:  normal appearing urethra with no masses, tenderness or lesions              Bartholins and Skenes: normal                 Vagina: normal appearing vagina with normal color and discharge, no lesions              Cervix: {exam; cervix:14595}              Pap taken: {yes no:314532} Bimanual Exam:  Uterus:  {exam; uterus:12215}              Adnexa: {exam; adnexa:12223}               Rectovaginal: Confirms               Anus:  normal sphincter tone, no lesions  Chaperone, ***Terence Lux, CMA, was present for exam.  A:  Well Woman with normal exam  P:   {plan; gyn:5269::"mammogram","pap smear","return annually or prn"}

## 2020-02-08 ENCOUNTER — Ambulatory Visit: Payer: Self-pay | Admitting: Obstetrics & Gynecology

## 2020-02-14 NOTE — Progress Notes (Signed)
59 y.o. G2P2 Married White or Caucasian female here for annual exam.  Denies vaginal bleeding.  Blood pressure is much improved.  Still having hot flashes.  On gabapentin and effexor and this has helped some.  Asked about some supplements today.    PCP:  Dr. Forde Dandy.  Last appt was in April.  Has appt in November.    Patient's last menstrual period was 05/03/1986.          Sexually active: Yes.    The current method of family planning is status post hysterectomy.    Exercising: No.  exercise Smoker:  no  Health Maintenance: Pap:  2016 neg History of abnormal Pap:  no MMG:  02-06-2019 category c density birads 2:neg.  Scheduled for 02/22/2020.   Colonoscopy: was scheduled for 02/2019.  She is planning to do this before the end of the year.   BMD:   Will see if pt can have done on Friday with MMG. TDaP:  Unsure.  Pt is aware I do not have documentation of this. Pneumonia vaccine(s):  Not done Shingrix:   Not done Hep C testing: neg 2020 Screening Labs: has this scheduled with Dr. Forde Dandy for next month.   reports that she quit smoking about 13 years ago. Her smoking use included cigarettes. She has never used smokeless tobacco. She reports that she does not drink alcohol and does not use drugs.  Past Medical History:  Diagnosis Date  . Breast cancer ([place])   . Endometriosis   . Genetic testing 02/10/2017   STAT Breast panel with reflex to Common Cancers panel (47 genes) - No pathogenic mutations detected  . Graves disease   . 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 History of radiation therapy 05/17/17-06/13/17   right breast 40.05 Gy in 15 fractions, right breast boost 10 Gy in 5 fractions  . Hyperlipidemia   . Hypertension   . Migraine   . Personal history of asthma   . PONV (postoperative nausea and vomiting)    "really sick when I wake up"    Past Surgical History:  Procedure Laterality Date  . ANTERIOR CRUCIATE LIGAMENT REPAIR Left 1991  . BREAST LUMPECTOMY WITH RADIOACTIVE SEED LOCALIZATION  Right 04/04/2017   Procedure: RIGHT BREAST LUMPECTOMY WITH RADIOACTIVE SEED LOCALIZATION;  Surgeon: Rolm Bookbinder, MD;  Location: [facility];  Service: General;  Laterality: Right;  . CHOLECYSTECTOMY, LAPAROSCOPIC  2001  . INCISIONAL HERNIA REPAIR  2001,2019   from lap chole  . MENISCUS REPAIR Left 1993  . ULNAR TUNNEL RELEASE Right   . VAGINAL HYSTERECTOMY  1988   for endometriosos    Current Outpatient Medications  Medication Sig Dispense Refill  . amLODipine (NORVASC) 5 MG tablet Take 5 mg by mouth daily.    Marland Kitchen gabapentin (NEURONTIN) 100 MG capsule Take 100 mg by mouth daily.    Marland Kitchen KLOR-CON M20 20 MEQ tablet Take 20 mEq by mouth daily.  5  . metoprolol succinate (TOPROL-XL) 25 MG 24 hr tablet Take 12.5 mg daily by mouth.     Marland Kitchen omeprazole (PRILOSEC) 20 MG capsule TAKE 1 CAPSULE DAILY 30 TO 60 MINUTES BEFORE BREAKFAST 90 capsule 3  . rosuvastatin (CRESTOR) 10 MG tablet Take 10 mg daily by mouth.     . SYNTHROID 150 MCG tablet Take 150 mcg by mouth daily.    Marland Kitchen venlafaxine XR (EFFEXOR-XR) 75 MG 24 hr capsule TAKE 1 CAPSULE DAILY WITH BREAKFAST 90 capsule 3  . ZETIA 10 MG tablet Take 10 mg daily by  mouth.      No current facility-administered medications for this visit.    Family History  Problem Relation Age of Onset  . Breast cancer Mother 51       TAH/BSO 15s; currently 59  . Diabetes Mellitus I Mother   . Lung cancer Mother 75  . Dementia Mother   . Heart attack Father   . Breast cancer Maternal Aunt 66       currently 1s  . Multiple myeloma Sister 80  . Cancer Maternal Uncle        unk. type; deceased 27s  . Cancer Cousin        son of mat uncle with cancer; unk. type; deceased 34s    Review of Systems  Constitutional: Negative.   HENT: Negative.   Eyes: Negative.   Respiratory: Negative.   Cardiovascular: Negative.   Gastrointestinal: Negative.   Endocrine: Negative.   Genitourinary: Negative.   Musculoskeletal: Negative.   Skin: Negative.    Allergic/Immunologic: Negative.   Neurological: Negative.   Hematological: Negative.   Psychiatric/Behavioral: Negative.     Exam:   BP 122/80   Pulse 68   Resp 16   Ht _0  (1.6 m)   Wt 156 lb (70.8 kg)   LMP 05/03/1986   BMI 27.63 kg/m   Height: _1  (160 cm)  General appearance: alert, cooperative and appears stated age Head: Normocephalic, without obvious abnormality, atraumatic Neck: no adenopathy, supple, symmetrical, trachea midline and thyroid normal to inspection and palpation Lungs: clear to auscultation bilaterally Breasts: left breast without masses, skin changes, nipple discharge or LAD; right breast with well healed scars, no new masses, no nipple discharge, no LAD Heart: regular rate and rhythm Abdomen: soft, non-tender; bowel sounds normal; firmness along scar from hernia repair that is stable,  no organomegaly Extremities: extremities normal, atraumatic, no cyanosis or edema Skin: Skin color, texture, turgor normal. No rashes or lesions Lymph nodes: Cervical, supraclavicular, and axillary nodes normal. No abnormal inguinal nodes palpated Neurologic: Grossly normal   Pelvic: External genitalia:  no lesions              Urethra:  normal appearing urethra with no masses, tenderness or lesions              Bartholins and Skenes: normal                 Vagina: normal appearing vagina with normal color and discharge, no lesions              Cervix: absent              Pap taken: No. Bimanual Exam:  Uterus:  uterus absent              Adnexa: no mass, fullness, tenderness               Rectovaginal: Confirms               Anus:  normal sphincter tone, no lesions  Chaperone, Terence Lux, CMA, was present for exam.  A:  Well Woman with normal exam PMP, no HRT H/o DCIS s/p lumpectomy and radiation.  Stopped Tamoxifen after a year as she could not tolerate side effects Hypothyroidism Hypertension   P:   Mammogram is scheduled for Friday.  We have  discussed breast MRI as well.  Pt has declined for now. pap smear not indicated BMD order placed to see if pt can have this done on Friday with  MMG Pt is going to have colonoscopy done before the end of the year Lab work scheduled for November with Dr. Forde Dandy Vaccines reviewed.  Declines shingrix and covid vaccination Return annually or prn

## 2020-02-18 ENCOUNTER — Ambulatory Visit: Payer: PRIVATE HEALTH INSURANCE | Admitting: Obstetrics & Gynecology

## 2020-02-18 ENCOUNTER — Encounter: Payer: Self-pay | Admitting: Obstetrics & Gynecology

## 2020-02-18 ENCOUNTER — Other Ambulatory Visit: Payer: Self-pay

## 2020-02-18 VITALS — BP 122/80 | HR 68 | Resp 16 | Ht 63.0 in | Wt 156.0 lb

## 2020-02-18 DIAGNOSIS — Z01419 Encounter for gynecological examination (general) (routine) without abnormal findings: Secondary | ICD-10-CM

## 2020-04-14 ENCOUNTER — Other Ambulatory Visit: Payer: Self-pay | Admitting: Oncology

## 2020-04-14 ENCOUNTER — Telehealth: Payer: Self-pay | Admitting: Obstetrics & Gynecology

## 2020-04-14 DIAGNOSIS — D0511 Intraductal carcinoma in situ of right breast: Secondary | ICD-10-CM

## 2020-04-17 ENCOUNTER — Other Ambulatory Visit: Payer: Self-pay

## 2020-04-17 MED ORDER — GABAPENTIN 300 MG PO CAPS
300.0000 mg | ORAL_CAPSULE | Freq: Every day | ORAL | 0 refills | Status: DC
Start: 2020-04-17 — End: 2020-07-18

## 2020-04-17 NOTE — Telephone Encounter (Signed)
Call from patient regarding refill request for Gabapentin being denied. Patient states that she needs refill sent to [facility]. Per patient, taking 300mg  once daily. Please advise  Medication refill request: Gabapentin Last AEX:  02/18/20 Dr. Sabra Heck Next AEX: none Last MMG (if hormonal medication request): n/a Refill authorized: today, please advise

## 2020-04-17 NOTE — Telephone Encounter (Signed)
Patient is calling in regards to refill being denied.

## 2020-06-02 ENCOUNTER — Other Ambulatory Visit: Payer: Self-pay | Admitting: *Deleted

## 2020-06-02 DIAGNOSIS — D0511 Intraductal carcinoma in situ of right breast: Secondary | ICD-10-CM

## 2020-06-02 NOTE — Progress Notes (Signed)
[facility]  Telephone:(336) [phone] Fax:(336) [phone]     ID: [id number] LATIMORE DOB: Apr 21, 1961  MR#: [id number]  NKN#:[id number]  Patient Care Team: Reynold Bowen, MD as PCP - General (Endocrinology) Megan Salon, MD as Consulting Physician (Gynecology) Rolm Bookbinder, MD as Consulting Physician (General Surgery) Mariluz Crespo, Virgie Dad, MD as Consulting Physician (Oncology) Gery Pray, MD as Consulting Physician (Radiation Oncology) Mansouraty, Telford Nab., MD as Consulting Physician (Gastroenterology) OTHER MD:  CHIEF COMPLAINT: Ductal carcinoma in situ, estrogen receptor positive  CURRENT TREATMENT: Observation   INTERVAL HISTORY: Perris returns today for follow-up of her ductal carcinoma in situ. She has opted to continue on observation.  Since her last visit, she underwent bilateral diagnostic mammography with tomography at [facility] on 02/22/2020 showing: breast density category C; no evidence of malignancy in either breast.    REVIEW OF SYSTEMS:  Jillene is not exercising regularly at present.  She is painting her house.  She likes to do gardening which she does in the spring and summer chiefly.  A detailed review of systems is otherwise stable   COVID 19 VACCINATION STATUS: Refuses vaccination; had COVID 21 May 2020, no sequela    HISTORY OF CURRENT ILLNESS: From the original intake note:  AZALIYAH KENNARD had routine screening mammography at [facility] 01/14/2016 showing an area of grouped calcifications in the central right breast. She was set up for six-month follow-up 07/20/2016 and this showed them to be stable, and probably benign, but repeat 6 month follow-up was recommended and on 01/25/2017 the patient underwent bilateral diagnostic mammography showing the breast density to be category C. In the right breast retroareolar area there was a new group of amorphous calcifications measuring 0.6 cm. Biopsy of this area 02/01/2017 showed (SAA  [id number]) ductal carcinoma in situ, intermediate grade, estrogen and progesterone receptor positive.  Right axillary ultrasound 02/01/2017 was sonographically benign  The patient's subsequent history is as detailed below.   PAST MEDICAL HISTORY: Past Medical History:  Diagnosis Date  . Breast cancer ([place])   . Endometriosis   . Genetic testing 02/10/2017   STAT Breast panel with reflex to Common Cancers panel (47 genes) - No pathogenic mutations detected  . Graves disease   . 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 History of radiation therapy 05/17/17-06/13/17   right breast 40.05 Gy in 15 fractions, right breast boost 10 Gy in 5 fractions  . Hyperlipidemia   . Hypertension   . Migraine   . Personal history of asthma   . PONV (postoperative nausea and vomiting)    "really sick when I wake up"    PAST SURGICAL HISTORY: Past Surgical History:  Procedure Laterality Date  . ANTERIOR CRUCIATE LIGAMENT REPAIR Left 1991  . BREAST LUMPECTOMY WITH RADIOACTIVE SEED LOCALIZATION Right 04/04/2017   Procedure: RIGHT BREAST LUMPECTOMY WITH RADIOACTIVE SEED LOCALIZATION;  Surgeon: Rolm Bookbinder, MD;  Location: [facility];  Service: General;  Laterality: Right;  . CHOLECYSTECTOMY, LAPAROSCOPIC  2001  . INCISIONAL HERNIA REPAIR  2001,2019   from lap chole  . MENISCUS REPAIR Left 1993  . ULNAR TUNNEL RELEASE Right   . VAGINAL HYSTERECTOMY  1988   for endometriosos    FAMILY HISTORY Family History  Problem Relation Age of Onset  . Breast cancer Mother 35       TAH/BSO 76s; currently 57  . Diabetes Mellitus I Mother   . Lung cancer Mother 38  . Dementia Mother   . Heart attack Father   . Breast cancer  Maternal Aunt 56       currently 68s  . Multiple myeloma Sister 78  . Cancer Maternal Uncle        unk. type; deceased 40s  . Cancer Cousin        son of mat uncle with cancer; unk. type; deceased 70s  Her father died at age 71 from a MI. Her mother is 3 years old as of October 2018. The patient's  mother has a history of lung cancer diagnosed at age 22 and breast cancer diagnosed at age 63. Pt has 3 sisters. One sister has myeloma. Her maternal aunt was diagnosed at age 42 with breast cancer.   GYNECOLOGIC HISTORY:  Patient's last menstrual period was 05/03/1986. Menarche: 60 years old Age at first live birth: 60 years old GP: GXP2 LMP: At age 60 due to a partial hysterectomy (no salpingo-oophorectomy). 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 She lives at home with her Husband, Louie Casa, who is a Administrator. She has an outside dog. Daughter Dietrich Pates lives in [place] and works as a Theme park manager. Daughter [place] lives in [place] and is a "stay at home mom". The patient has 6 grandchildren, all boys.    ADVANCED DIRECTIVES: Yes, her husband is her healthcare POA.    HEALTH MAINTENANCE: Social History   Tobacco Use  . Smoking status: Former Smoker    Types: Cigarettes    Quit date: 05/05/2006    Years since quitting: 14.0  . Smokeless tobacco: Never Used  Vaping Use  . Vaping Use: Never used  Substance Use Topics  . Alcohol use: No  . Drug use: No     Colonoscopy: No  PAP: Last on 03/18/2015 and negative.  Bone density: No   Allergies  Allergen Reactions  . Phenergan [Promethazine Hcl] Nausea Only    Current Outpatient Medications  Medication Sig Dispense Refill  . amLODipine (NORVASC) 5 MG tablet Take 5 mg by mouth daily.    Marland Kitchen gabapentin (NEURONTIN) 300 MG capsule Take 1 capsule (300 mg total) by mouth daily. 90 capsule 0  . KLOR-CON M20 20 MEQ tablet Take 20 mEq by mouth daily.  5  . metoprolol succinate (TOPROL-XL) 25 MG 24 hr tablet Take 12.5 mg daily by mouth.     Marland Kitchen omeprazole (PRILOSEC) 20 MG capsule TAKE 1 CAPSULE DAILY 30 TO 60 MINUTES BEFORE BREAKFAST 90 capsule 3  . rosuvastatin (CRESTOR) 10 MG tablet Take 10 mg daily by mouth.     . SYNTHROID 150 MCG tablet Take 150 mcg by mouth daily.    Marland Kitchen  venlafaxine XR (EFFEXOR-XR) 75 MG 24 hr capsule TAKE 1 CAPSULE EVERY MORNING WITH BREAKFAST 90 capsule 3  . ZETIA 10 MG tablet Take 10 mg daily by mouth.      No current facility-administered medications for this visit.    OBJECTIVE: white woman who appears younger than stated age  5:   06/03/20 1322  BP: 138/78  Pulse: 74  Resp: 15  Temp: 97.7 F (36.5 C)  SpO2: 99%     Body mass index is 26.54 kg/m.   Wt Readings from Last 3 Encounters:  06/03/20 149 lb 12.8 oz (67.9 kg)  02/18/20 156 lb (70.8 kg)  06/04/19 164 lb 11.2 oz (74.7 kg)      ECOG FS:1 - Symptomatic but completely ambulatory  Sclerae unicteric, EOMs intact Wearing a mask No cervical or supraclavicular adenopathy Lungs no rales or  rhonchi Heart regular rate and rhythm Abd soft, nontender, positive bowel sounds MSK no focal spinal tenderness, no upper extremity lymphedema Neuro: nonfocal, well oriented, appropriate affect Breasts: The right breast is status post lumpectomy and radiation.  There is mild distortion of the contour and some firmness but no evidence of local recurrence.  The left breast is benign.  Both axillae are benign.   LAB RESULTS:  CMP     Component Value Date/Time   NA 141 06/04/2019 1334   NA 142 02/09/2017 0846   K 3.7 06/04/2019 1334   K 3.3 (L) 02/09/2017 0846   CL 104 06/04/2019 1334   CO2 28 06/04/2019 1334   CO2 27 02/09/2017 0846   GLUCOSE 95 06/04/2019 1334   GLUCOSE 87 02/09/2017 0846   BUN 7 06/04/2019 1334   BUN 6.8 (L) 02/09/2017 0846   CREATININE 0.67 06/04/2019 1334   CREATININE 0.7 02/09/2017 0846   CALCIUM 9.5 06/04/2019 1334   CALCIUM 10.2 02/09/2017 0846   PROT 7.5 06/04/2019 1334   PROT 7.6 02/09/2017 0846   ALBUMIN 4.3 06/04/2019 1334   ALBUMIN 4.3 02/09/2017 0846   AST 88 (H) 06/04/2019 1334   AST 21 02/09/2017 0846   ALT 87 (H) 06/04/2019 1334   ALT 25 02/09/2017 0846   ALKPHOS 117 06/04/2019 1334   ALKPHOS 58 02/09/2017 0846   BILITOT 0.3  06/04/2019 1334   BILITOT 0.53 02/09/2017 0846   GFRNONAA >60 06/04/2019 1334   GFRAA >60 06/04/2019 1334    No results found for: TOTALPROTELP, ALBUMINELP, A1GS, A2GS, BETS, BETA2SER, GAMS, MSPIKE, SPEI  No results found for: KPAFRELGTCHN, LAMBDASER, KAPLAMBRATIO  Lab Results  Component Value Date   WBC 6.2 06/03/2020   NEUTROABS 3.3 06/03/2020   HGB 14.0 06/03/2020   HCT 41.3 06/03/2020   MCV 88.2 06/03/2020   PLT 141 (L) 06/03/2020   No results found for: LABCA2  No components found for: INOMVE720  No results for input(s): INR in the last 168 hours.  No results found for: LABCA2  No results found for: NOB096  No results found for: GEZ662  No results found for: HUT654  No results found for: CA2729  No components found for: HGQUANT  No results found for: CEA1 / No results found for: CEA1   No results found for: AFPTUMOR  No results found for: CHROMOGRNA  No results found for: HGBA, HGBA2QUANT, HGBFQUANT, HGBSQUAN (Hemoglobinopathy evaluation)   No results found for: LDH  Lab Results  Component Value Date   IRON 112 02/15/2019   IRONPCTSAT 28.5 02/15/2019   (Iron and TIBC)  Lab Results  Component Value Date   FERRITIN 140.5 02/15/2019    Urinalysis    Component Value Date/Time   BILIRUBINUR N 03/17/2018 1513   PROTEINUR Negative 03/17/2018 1513   UROBILINOGEN 0.2 03/17/2018 1513   NITRITE N 03/17/2018 1513   LEUKOCYTESUR Negative 03/17/2018 1513    STUDIES: No results found.   ELIGIBLE FOR AVAILABLE RESEARCH PROTOCOL: COMET, declined  ASSESSMENT: 61 y.o. [place] woman Status post right breast retroareolar biopsy 02/01/2017 for ductal carcinoma in situ grade 2, estrogen and progesterone receptor positive  (1) genetics testing through the STAT Breast panel with reflex to  [facility]'sCommon Cancers panel (47 genes) found no pathogenic mutations in APC, ATM, AXIN2, BARD1, BMPR1A, BRCA1, BRCA2, BRIP1, CDH1, CDK4, CDKN2A, CHEK2, CTNNA1,  DICER1, EPCAM, GREM1, HOXB13, KIT, MEN1, MLH1, MSH2, MSH3, MSH6, MUTYH, NBN, NF1, NTHL1, PALB2, PDGFRA, PMS2, POLD1, POLE, PTEN, RAD50, RAD51C, RAD51D, SDHA, SDHB, SDHC,  SDHD, SMAD4, SMARCA4, STK11, TP53, TSC1, TSC2, VHL.   (2) post right lumpectomy 04/04/2017 showing only residual atypical ductal hyperplasia  (3) adjuvant radiation 05/17/17-06/13/17: 40.05 Gy to the right breast with 10 Gy boost  (4) tamoxifen started 08/17/2017, discontinued January 2020  (5) intermittent tobacco abuse: Has definitively quit smoking as of September 2018.    (a)  5 pack year cumulative tobacco history (10 years all together with 1/2ppd)   PLAN: Tiersa is now a little over 3 years out from definitive surgery for her breast cancer with no evidence of disease recurrence.  This is very favorable.  We reviewed the fact that her breast cancer was not invasive.  It cannot take her life.  She has excellent follow-up through her primary care physician Dr. Forde Dandy and her gynecologist Dr. Sabra Heck.  Accordingly at this point I am comfortable releasing her to their care.  All she will need in terms of breast cancer follow-up is a yearly physician breast exam and her yearly mammography.  I will be glad to see Analis again at any point in the future if and when the need arises but as of now are making no further routine appointments for her here.  Total encounter time 20 minutes.Sarajane Jews C. Chesky Heyer, MD 06/03/20 1:24 PM Medical Oncology and Hematology [facility] [place], [place] [address] Tel. [phone]    Fax. [phone]   I, Wilburn Mylar, am acting as scribe for Dr. Virgie Dad. Bekka Qian.  I, Lurline Del MD, have reviewed the above documentation for accuracy and completeness, and I agree with the above.   *Total Encounter Time as defined by the Centers for Medicare and Medicaid Services includes, in addition to the face-to-face time of a patient visit (documented in the  note above) non-face-to-face time: obtaining and reviewing outside history, ordering and reviewing medications, tests or procedures, care coordination (communications with other health care professionals or caregivers) and documentation in the medical record.

## 2020-06-03 ENCOUNTER — Inpatient Hospital Stay: Payer: No Typology Code available for payment source

## 2020-06-03 ENCOUNTER — Other Ambulatory Visit: Payer: Self-pay

## 2020-06-03 ENCOUNTER — Inpatient Hospital Stay: Payer: No Typology Code available for payment source | Attending: Oncology | Admitting: Oncology

## 2020-06-03 VITALS — BP 138/78 | HR 74 | Temp 97.7°F | Resp 15 | Ht 63.0 in | Wt 149.8 lb

## 2020-06-03 DIAGNOSIS — K760 Fatty (change of) liver, not elsewhere classified: Secondary | ICD-10-CM | POA: Diagnosis not present

## 2020-06-03 DIAGNOSIS — Z170 Estrogen receptor positive status [ER+]: Secondary | ICD-10-CM | POA: Diagnosis not present

## 2020-06-03 DIAGNOSIS — D0511 Intraductal carcinoma in situ of right breast: Secondary | ICD-10-CM

## 2020-06-03 DIAGNOSIS — E785 Hyperlipidemia, unspecified: Secondary | ICD-10-CM | POA: Insufficient documentation

## 2020-06-03 DIAGNOSIS — I10 Essential (primary) hypertension: Secondary | ICD-10-CM | POA: Insufficient documentation

## 2020-06-03 DIAGNOSIS — I158 Other secondary hypertension: Secondary | ICD-10-CM

## 2020-06-03 DIAGNOSIS — Z87891 Personal history of nicotine dependence: Secondary | ICD-10-CM | POA: Diagnosis not present

## 2020-06-03 DIAGNOSIS — Z79899 Other long term (current) drug therapy: Secondary | ICD-10-CM | POA: Diagnosis not present

## 2020-06-03 DIAGNOSIS — Z923 Personal history of irradiation: Secondary | ICD-10-CM | POA: Insufficient documentation

## 2020-06-03 LAB — CBC WITH DIFFERENTIAL (CANCER CENTER ONLY)
Abs Immature Granulocytes: 0.02 10*3/uL (ref 0.00–0.07)
Basophils Absolute: 0.1 10*3/uL (ref 0.0–0.1)
Basophils Relative: 1 %
Eosinophils Absolute: 0.1 10*3/uL (ref 0.0–0.5)
Eosinophils Relative: 2 %
HCT: 41.3 % (ref 36.0–46.0)
Hemoglobin: 14 g/dL (ref 12.0–15.0)
Immature Granulocytes: 0 %
Lymphocytes Relative: 39 %
Lymphs Abs: 2.4 10*3/uL (ref 0.7–4.0)
MCH: 29.9 pg (ref 26.0–34.0)
MCHC: 33.9 g/dL (ref 30.0–36.0)
MCV: 88.2 fL (ref 80.0–100.0)
Monocytes Absolute: 0.4 10*3/uL (ref 0.1–1.0)
Monocytes Relative: 6 %
Neutro Abs: 3.3 10*3/uL (ref 1.7–7.7)
Neutrophils Relative %: 52 %
Platelet Count: 141 10*3/uL — ABNORMAL LOW (ref 150–400)
RBC: 4.68 MIL/uL (ref 3.87–5.11)
RDW: 13 % (ref 11.5–15.5)
WBC Count: 6.2 10*3/uL (ref 4.0–10.5)
nRBC: 0 % (ref 0.0–0.2)

## 2020-06-03 LAB — CMP (CANCER CENTER ONLY)
ALT: 32 U/L (ref 0–44)
AST: 29 U/L (ref 15–41)
Albumin: 4.2 g/dL (ref 3.5–5.0)
Alkaline Phosphatase: 106 U/L (ref 38–126)
Anion gap: 7 (ref 5–15)
BUN: 7 mg/dL — ABNORMAL LOW (ref 8–23)
CO2: 25 mmol/L (ref 22–32)
Calcium: 9.7 mg/dL (ref 8.9–10.3)
Chloride: 108 mmol/L (ref 98–111)
Creatinine: 0.71 mg/dL (ref 0.44–1.00)
GFR, Estimated: >60 mL/min (ref >60)
Glucose, Bld: 97 mg/dL (ref 70–99)
Potassium: 4.1 mmol/L (ref 3.5–5.1)
Sodium: 140 mmol/L (ref 135–145)
Total Bilirubin: 0.5 mg/dL (ref 0.3–1.2)
Total Protein: 7.5 g/dL (ref 6.5–8.1)

## 2020-06-05 ENCOUNTER — Telehealth: Payer: Self-pay | Admitting: Oncology

## 2020-06-05 NOTE — Telephone Encounter (Signed)
No 2/1 los. No changes made to pt's schedule  

## 2020-07-18 ENCOUNTER — Other Ambulatory Visit: Payer: Self-pay | Admitting: Obstetrics and Gynecology

## 2020-07-18 NOTE — Telephone Encounter (Signed)
Spoke with patient in regard to refill request for gabapentin 300 mg caps daily for vasomotor symptoms. Patient reports medication is working well and request to continue.  Last AEX 04/17/20 w/ Dr. Sabra Heck.  No AEX scheduled.  Patient request to schedule next AEX with provider at [facility].  AEX scheduled for 02/19/21 w/ Dr. Quincy Simmonds.   States she has "half of bottle" of RX remaining.  Advised I will review request with Dr. Quincy Simmonds and f/u if any additional recommendations, otherwise f/u with pharmacy to be filled. Patient agreeable.   Dr. Quincy Simmonds -please advise on refill.

## 2020-10-16 ENCOUNTER — Other Ambulatory Visit: Payer: Self-pay | Admitting: Physician Assistant

## 2021-02-19 ENCOUNTER — Ambulatory Visit: Payer: Self-pay | Admitting: Obstetrics and Gynecology

## 2021-03-06 ENCOUNTER — Encounter (HOSPITAL_BASED_OUTPATIENT_CLINIC_OR_DEPARTMENT_OTHER): Payer: Self-pay | Admitting: *Deleted

## 2021-04-10 ENCOUNTER — Other Ambulatory Visit: Payer: Self-pay | Admitting: Oncology

## 2021-04-10 DIAGNOSIS — D0511 Intraductal carcinoma in situ of right breast: Secondary | ICD-10-CM

## 2021-05-13 ENCOUNTER — Ambulatory Visit (HOSPITAL_BASED_OUTPATIENT_CLINIC_OR_DEPARTMENT_OTHER): Payer: No Typology Code available for payment source | Admitting: Obstetrics & Gynecology

## 2021-06-02 ENCOUNTER — Encounter (HOSPITAL_BASED_OUTPATIENT_CLINIC_OR_DEPARTMENT_OTHER): Payer: Self-pay | Admitting: Obstetrics & Gynecology

## 2021-06-02 ENCOUNTER — Other Ambulatory Visit: Payer: Self-pay

## 2021-06-02 ENCOUNTER — Ambulatory Visit (INDEPENDENT_AMBULATORY_CARE_PROVIDER_SITE_OTHER): Payer: Managed Care, Other (non HMO) | Admitting: Obstetrics & Gynecology

## 2021-06-02 VITALS — BP 157/86 | HR 79 | Ht 63.0 in | Wt 157.2 lb

## 2021-06-02 DIAGNOSIS — Z853 Personal history of malignant neoplasm of breast: Secondary | ICD-10-CM

## 2021-06-02 DIAGNOSIS — R232 Flushing: Secondary | ICD-10-CM

## 2021-06-02 DIAGNOSIS — Z90710 Acquired absence of both cervix and uterus: Secondary | ICD-10-CM

## 2021-06-02 DIAGNOSIS — M810 Age-related osteoporosis without current pathological fracture: Secondary | ICD-10-CM

## 2021-06-02 DIAGNOSIS — Z1211 Encounter for screening for malignant neoplasm of colon: Secondary | ICD-10-CM

## 2021-06-02 DIAGNOSIS — Z01419 Encounter for gynecological examination (general) (routine) without abnormal findings: Secondary | ICD-10-CM

## 2021-06-02 DIAGNOSIS — D0511 Intraductal carcinoma in situ of right breast: Secondary | ICD-10-CM

## 2021-06-02 DIAGNOSIS — Z803 Family history of malignant neoplasm of breast: Secondary | ICD-10-CM

## 2021-06-02 MED ORDER — GABAPENTIN 100 MG PO CAPS: ORAL_CAPSULE | ORAL | 1 refills | Status: DC

## 2021-06-02 NOTE — Progress Notes (Signed)
61 y.o. G2P2 Married White or Caucasian female here for annual exam.  Denies vaginal bleeding.  Has retired.  Helping to take care of in-laws.  Would like to stop gabapentin.  Instructions given.    Patient's last menstrual period was 05/03/1986.          Sexually active: Yes.    The current method of family planning is status post hysterectomy.    Exercising: walking Smoker:  no  Health Maintenance: Pap:  2016 History of abnormal Pap:  no MMG:  03/04/21 benign Colonoscopy:  was cancelled due to hypertension.  Willing to do cologuard BMD:   09/25/20 osteoporosis.  Plan follow up next and if worse, will discuss treatments Screening Labs: Dr. Forde Dandy   reports that she quit smoking about 15 years ago. Her smoking use included cigarettes. She has never used smokeless tobacco. She reports that she does not drink alcohol and does not use drugs.  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    History of radiation therapy 05/17/17-06/13/17   right breast 40.05 Gy in 15 fractions, right breast boost 10 Gy in 5 fractions   Hyperlipidemia    Hypertension    Migraine    Personal history of asthma    PONV (postoperative nausea and vomiting)    "really sick when I wake up"    Past Surgical History:  Procedure Laterality Date   ANTERIOR CRUCIATE LIGAMENT REPAIR Left 1991   BREAST LUMPECTOMY WITH RADIOACTIVE SEED LOCALIZATION Right 04/04/2017   Procedure: RIGHT BREAST LUMPECTOMY WITH RADIOACTIVE SEED LOCALIZATION;  Surgeon: Rolm Bookbinder, MD;  Location: [facility];  Service: General;  Laterality: Right;   CHOLECYSTECTOMY, [place]  2001,2019   from lap chole   MENISCUS REPAIR Left [place]   for  endometriosos    Current Outpatient Medications  Medication Sig Dispense Refill   amLODipine (NORVASC) 5 MG tablet Take 5 mg by mouth daily.     KLOR-CON M20 20 MEQ tablet Take 20 mEq by mouth daily.  5   metoprolol succinate (TOPROL-XL) 25 MG 24 hr tablet Take 12.5 mg daily by mouth.      omeprazole (PRILOSEC) 20 MG capsule TAKE 1 CAPSULE DAILY 30 TO 60 MINUTES BEFORE BREAKFAST 90 capsule 3   rosuvastatin (CRESTOR) 10 MG tablet Take 10 mg daily by mouth.      SYNTHROID 150 MCG tablet Take 150 mcg by mouth daily.     venlafaxine XR (EFFEXOR-XR) 75 MG 24 hr capsule TAKE 1 CAPSULE EVERY MORNING WITH BREAKFAST 90 capsule 3   ZETIA 10 MG tablet Take 10 mg daily by mouth.      gabapentin (NEURONTIN) 100 MG capsule Take 3 capsules nightly for 4 days, then decrease to 2 capsules nightly for 2 days and then decrease to 1 capsule nightly for 4 days and then stop if possible. 60 capsule 1   No current facility-administered medications for this visit.    Family History  Problem Relation Age of Onset   Breast cancer Mother 62       TAH/BSO 89s; currently 87   Diabetes Mellitus I Mother    Lung cancer Mother 74  Dementia Mother    Heart attack Father    Breast cancer Maternal Aunt 42       currently 86s   Multiple myeloma Sister 2   Cancer Maternal Uncle        unk. type; deceased 30s   Cancer Cousin        son of mat uncle with cancer; unk. type; deceased 59s    Review of Systems  All other systems reviewed and are negative.  Exam:   BP (!) 157/86    Pulse 79    Ht 5' 3"  (1.6 m)    Wt 157 lb 3.2 oz (71.3 kg)    LMP 05/03/1986    BMI 27.85 kg/m   Height: 5' 3"  (160 cm)  General appearance: alert, cooperative and appears stated age Head: Normocephalic, without obvious abnormality, atraumatic Neck: no adenopathy, supple, symmetrical, trachea midline and thyroid normal to inspection and palpation Lungs: clear to auscultation bilaterally Breasts: normal appearance, no masses or  tenderness Heart: regular rate and rhythm Abdomen: soft, non-tender; bowel sounds normal; no masses,  no organomegaly Extremities: extremities normal, atraumatic, no cyanosis or edema Skin: Skin color, texture, turgor normal. No rashes or lesions Lymph nodes: Cervical, supraclavicular, and axillary nodes normal. No abnormal inguinal nodes palpated Neurologic: Grossly normal   Pelvic: External genitalia:  no lesions              Urethra:  normal appearing urethra with no masses, tenderness or lesions              Bartholins and Skenes: normal                 Vagina: normal appearing vagina with normal color and no discharge, no lesions              Cervix: absent              Pap taken: No. Bimanual Exam:  Uterus:  uterus absent              Adnexa: no mass, fullness, tenderness               Rectovaginal: Confirms               Anus:  normal sphincter tone, no lesions  Chaperone, Ezekiel Ina, RN, was present for exam.  Assessment/Plan: 1. Well woman exam with routine gynecological exam - pap not indicated - cologuard ordered.  Colonoscopy was cancelled due to hypertension - MMG up to date - plan repeat BMD next year.  May need to consider treatment then - lab work with Dr. Forde Dandy was done in January - vaccines reviewed/updated  2. Ductal carcinoma in situ of right breast - released from oncology 2022  3. Hot flashes - pt is going to wean off the gabapentin  4. Age-related osteoporosis without current pathological fracture  5. Family history of breast cancer  6. H/O total vaginal hysterectomy, endometriosis 1988

## 2021-06-27 LAB — COLOGUARD

## 2021-07-23 LAB — COLOGUARD: COLOGUARD: NEGATIVE

## 2021-09-08 ENCOUNTER — Encounter (HOSPITAL_BASED_OUTPATIENT_CLINIC_OR_DEPARTMENT_OTHER): Payer: Self-pay | Admitting: Obstetrics & Gynecology

## 2021-09-11 ENCOUNTER — Other Ambulatory Visit (HOSPITAL_BASED_OUTPATIENT_CLINIC_OR_DEPARTMENT_OTHER): Payer: Self-pay | Admitting: Obstetrics & Gynecology

## 2021-09-11 DIAGNOSIS — D0511 Intraductal carcinoma in situ of right breast: Secondary | ICD-10-CM

## 2021-09-11 MED ORDER — VENLAFAXINE HCL ER 75 MG PO CP24: 75.0000 mg | ORAL_CAPSULE | Freq: Every day | ORAL | 3 refills | Status: DC

## 2022-03-30 ENCOUNTER — Encounter (HOSPITAL_BASED_OUTPATIENT_CLINIC_OR_DEPARTMENT_OTHER): Payer: Self-pay | Admitting: *Deleted

## 2022-06-07 ENCOUNTER — Encounter (HOSPITAL_BASED_OUTPATIENT_CLINIC_OR_DEPARTMENT_OTHER): Payer: Self-pay | Admitting: Obstetrics & Gynecology

## 2022-06-07 ENCOUNTER — Ambulatory Visit (INDEPENDENT_AMBULATORY_CARE_PROVIDER_SITE_OTHER): Payer: Commercial Managed Care - HMO | Admitting: Obstetrics & Gynecology

## 2022-06-07 VITALS — BP 136/80 | HR 82 | Ht 63.0 in | Wt 152.8 lb

## 2022-06-07 DIAGNOSIS — D0511 Intraductal carcinoma in situ of right breast: Secondary | ICD-10-CM

## 2022-06-07 DIAGNOSIS — Z803 Family history of malignant neoplasm of breast: Secondary | ICD-10-CM

## 2022-06-07 DIAGNOSIS — Z90710 Acquired absence of both cervix and uterus: Secondary | ICD-10-CM | POA: Diagnosis not present

## 2022-06-07 DIAGNOSIS — Z1401 Asymptomatic hemophilia A carrier: Secondary | ICD-10-CM

## 2022-06-07 DIAGNOSIS — M810 Age-related osteoporosis without current pathological fracture: Secondary | ICD-10-CM

## 2022-06-07 DIAGNOSIS — Z01419 Encounter for gynecological examination (general) (routine) without abnormal findings: Secondary | ICD-10-CM | POA: Diagnosis not present

## 2022-06-07 DIAGNOSIS — R232 Flushing: Secondary | ICD-10-CM | POA: Diagnosis not present

## 2022-06-07 MED ORDER — VENLAFAXINE HCL ER 75 MG PO CP24: 75.0000 mg | ORAL_CAPSULE | Freq: Every day | ORAL | 3 refills | Status: DC

## 2022-06-07 NOTE — Progress Notes (Addendum)
62 y.o. G2P2 Married White or Caucasian female here for annual exam.  Doing well.  Denies vaginal bleeding.  She did stop her gabapentin.  Has noticed more hot flashes.  Helping with in-laws care.    Patient's last menstrual period was 05/03/1986.          Sexually active: Yes.    The current method of family planning is status post hysterectomy and post menopausal status.    Exercising: in the summer Smoker:  no  Health Maintenance: Pap:  03/18/2015, h/o vaginal hysterectomy History of abnormal Pap:  no MMG:  03/18/2022 Stable lumpectomy Colonoscopy:  07/2021 cologuard is negative BMD:   09/25/2020, osteoporosis Screening Labs: with PCP   reports that she quit smoking about 16 years ago. Her smoking use included cigarettes. She has never used smokeless tobacco. She reports that she does not drink alcohol and does not use drugs.  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    History of radiation therapy 05/17/17-06/13/17   right breast 40.05 Gy in 15 fractions, right breast boost 10 Gy in 5 fractions   Hyperlipidemia    Hypertension    Migraine    Personal history of asthma    PONV (postoperative nausea and vomiting)    "really sick when I wake up"    Past Surgical History:  Procedure Laterality Date   ANTERIOR CRUCIATE LIGAMENT REPAIR Left 1991   BREAST LUMPECTOMY WITH RADIOACTIVE SEED LOCALIZATION Right 04/04/2017   Procedure: RIGHT BREAST LUMPECTOMY WITH RADIOACTIVE SEED LOCALIZATION;  Surgeon: Rolm Bookbinder, MD;  Location: [facility];  Service: General;  Laterality: Right;   CHOLECYSTECTOMY, [place]  2001,2019   from lap chole   MENISCUS REPAIR Left [place]   for endometriosos     Current Outpatient Medications  Medication Sig Dispense Refill   amLODipine (NORVASC) 5 MG tablet Take 5 mg by mouth daily.     KLOR-CON M20 20 MEQ tablet Take 20 mEq by mouth daily.  5   metoprolol succinate (TOPROL-XL) 25 MG 24 hr tablet Take 12.5 mg daily by mouth.      omeprazole (PRILOSEC) 20 MG capsule TAKE 1 CAPSULE DAILY 30 TO 60 MINUTES BEFORE BREAKFAST 90 capsule 3   rOPINIRole (REQUIP) 0.25 MG tablet Take 0.25 mg by mouth at bedtime.     rosuvastatin (CRESTOR) 10 MG tablet Take 10 mg daily by mouth.      SYNTHROID 150 MCG tablet Take 150 mcg by mouth daily.     venlafaxine XR (EFFEXOR-XR) 75 MG 24 hr capsule Take 1 capsule (75 mg total) by mouth daily with breakfast. 90 capsule 3   Vitamin D, Ergocalciferol, (DRISDOL) 1.25 MG (50000 UNIT) CAPS capsule Take 50,000 Units by mouth once a week.     ZETIA 10 MG tablet Take 10 mg daily by mouth.      No current facility-administered medications for this visit.    Family History  Problem Relation Age of Onset   Breast cancer Mother 52       TAH/BSO 48s; currently 38   Diabetes Mellitus I Mother    Lung cancer Mother 90   Dementia Mother  Heart attack Father    Breast cancer Maternal Aunt 48       currently 71s   Multiple myeloma Sister 15   Cancer Maternal Uncle        unk. type; deceased 90s   Cancer Cousin        son of mat uncle with cancer; unk. type; deceased 47s    ROS: Constitutional: negative Genitourinary:negative  Exam:   BP 136/80   Pulse 79   Wt 152 lb 12.8 oz (69.3 kg)   LMP 05/03/1986   BMI 27.07 kg/m      General appearance: alert, cooperative and appears stated age Head: Normocephalic, without obvious abnormality, atraumatic Neck: no adenopathy, supple, symmetrical, trachea midline and thyroid normal to inspection and palpation Lungs: clear to auscultation bilaterally Breasts: normal appearance, no masses or tenderness Heart: regular rate and rhythm Abdomen: soft, non-tender; bowel sounds  normal; no masses,  no organomegaly Extremities: extremities normal, atraumatic, no cyanosis or edema Skin: Skin color, texture, turgor normal. No rashes or lesions Lymph nodes: Cervical, supraclavicular, and axillary nodes normal. No abnormal inguinal nodes palpated Neurologic: noticeable head tremor today   Pelvic: External genitalia:  no lesions              Urethra:  normal appearing urethra with no masses, tenderness or lesions              Bartholins and Skenes: normal                 Cervix: absent              Pap taken: No. Bimanual Exam:  Uterus:  uterus absent              Adnexa: no mass, fullness, tenderness               Rectovaginal: Confirms               Anus:  normal sphincter tone, no lesions  Chaperone, Octaviano Batty, CMA, was present for exam.  Assessment/Plan: 1. Well woman exam with routine gynecological exam - Pap smear not indicated - Mammogram 03/2022 - Colonoscopy has not been done but cologuard negative 07/2021 - Bone mineral density 2022 - lab work done with PCP, Dr. Forde Dandy - vaccines reviewed/updated  2. Ductal carcinoma in situ of right breast - was followed by Dr. Jana Hakim but he has retired and pt is going to continue to follow with me  3. Hot flashes - venlafaxine XR (EFFEXOR-XR) 75 MG 24 hr capsule; Take 1 capsule (75 mg total) by mouth daily with breakfast.  Dispense: 90 capsule; Refill: 3  4. H/O total vaginal hysterectomy, endometriosis 1988  5. 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 Family history of breast cancer  7. Age-related osteoporosis without current pathological fracture - will repeat next year.  Pt declines treatment at this time.  8.  Head tremor - this is much more noticeable than when last seen about a year ago.  Says she has discussed with Dr. Forde Dandy, PCP.  Suggested considering neurology referral as there is significant change in her symptoms compared to when I saw her last.

## 2022-06-21 ENCOUNTER — Encounter: Payer: Self-pay | Admitting: *Deleted

## 2022-07-08 ENCOUNTER — Encounter (HOSPITAL_BASED_OUTPATIENT_CLINIC_OR_DEPARTMENT_OTHER): Payer: Self-pay | Admitting: Obstetrics & Gynecology

## 2022-07-08 ENCOUNTER — Other Ambulatory Visit (HOSPITAL_BASED_OUTPATIENT_CLINIC_OR_DEPARTMENT_OTHER): Payer: Self-pay | Admitting: Obstetrics & Gynecology

## 2022-07-08 DIAGNOSIS — G250 Essential tremor: Secondary | ICD-10-CM

## 2022-07-09 ENCOUNTER — Encounter: Payer: Self-pay | Admitting: Neurology

## 2022-08-17 NOTE — Progress Notes (Unsigned)
Assessment/Plan:   Cervical Dystonia  -I talked to the patient about the nature and pathophysiology.  The primary muscles involved are the left sternocleidomastoid and right splenius capitis.  There is a bit of left Levator scapulae involvement but may not do that one for the first injections.   We talked about treatments.  We talked about the value of botox.  The patient was educated on the botulinum toxin the black blox warning and given a copy of the botox patient medication guide.  The patient understands that this warning states that there have been reported cases of the Botox extending beyond the injection site and creating adverse effects, similar to those of botulism. This included loss of strength, trouble walking, hoarseness, trouble saying words clearly, loss of bladder control, trouble breathing, trouble swallowing, diplopia, blurry vision and ptosis. Most of the distant spread of Botox was happening in patients, primarily children, who received medication for spasticity or for cervical dystonia. The patient expressed understanding and desire to proceed.  I will try to get authorization.  Pt information given to botox savings program -Mild hand tremor not bothersome the patient and she does not want medication for that.  Subjective:   Michelle Fernandez was seen today in neurologic consultation at the request of Jerene Bears, MD.  Pt with husband today who supplements hx.  The consultation is for the evaluation of tremor.  I saw the patient back in early 2018 for the same, at which point she had had tremor in her head for a few years.  We diagnosed her with cervical dystonia.  We talked about treatments, including Botox.  At that point in time, she really did not want treatment.  She has a hx of graves ds since age of 62y/o but in remission for years.  Her head tremor is more bothersome now than it was.  Its all day.  She notes it when waking up and head on the pillow but others note it as  well.  Her eye doc noted it when examining her.  No restriction in head restriction/rotation.  She has some hand tremor but doesn't wnt more medication for that.  She is on requip for rls.  Affected by caffeine:  No.  Affected by alcohol:  Doesn't drink Affected by stress:  Yes.   Affected by fatigue:  No. Spills soup if on spoon:  No. Spills glass of liquid if full:  No. Affects ADL's (tying shoes, brushing teeth, etc):  No.    ALLERGIES:   Allergies  Allergen Reactions   Phenergan [Promethazine Hcl] Nausea Only    CURRENT MEDICATIONS:  Outpatient Encounter Medications as of 08/18/2022  Medication Sig   amLODipine (NORVASC) 5 MG tablet Take 5 mg by mouth daily.   KLOR-CON M20 20 MEQ tablet Take 20 mEq by mouth daily.   metoprolol succinate (TOPROL-XL) 25 MG 24 hr tablet Take 12.5 mg daily by mouth.    omeprazole (PRILOSEC) 20 MG capsule TAKE 1 CAPSULE DAILY 30 TO 60 MINUTES BEFORE BREAKFAST   rOPINIRole (REQUIP) 0.25 MG tablet Take 0.25 mg by mouth at bedtime.   rosuvastatin (CRESTOR) 10 MG tablet Take 10 mg daily by mouth.    SYNTHROID 150 MCG tablet Take 150 mcg by mouth daily.   venlafaxine XR (EFFEXOR-XR) 75 MG 24 hr capsule Take 1 capsule (75 mg total) by mouth daily with breakfast.   Vitamin D, Ergocalciferol, (DRISDOL) 1.25 MG (50000 UNIT) CAPS capsule Take 50,000 Units by mouth once a  week.   ZETIA 10 MG tablet Take 10 mg daily by mouth.    No facility-administered encounter medications on file as of 08/18/2022.    Objective:   PHYSICAL EXAMINATION:    VITALS:   Vitals:   08/18/22 1016  BP: 124/86  Pulse: 88  SpO2: 98%  Weight: 151 lb 9.6 oz (68.8 kg)  Height:  (1.6 m)    GEN:  Normal appears female in no acute distress.  Appears stated age. HEENT:  Normocephalic, atraumatic. The mucous membranes are moist. The superficial temporal arteries are without ropiness or tenderness. Cardiovascular: Regular rate and rhythm. Lungs: Clear to auscultation  bilaterally. Neck/Heme: There are no carotid bruits noted bilaterally.  Head/neck is turned to the right and SB left.  She exhibits a geste antagoniste with hand on chin.  NEUROLOGICAL: Orientation:  The patient is alert and oriented x 3.   Cranial nerves: There is good facial symmetry.  Extraocular muscles are intact and visual fields are full to confrontational testing. Speech is fluent and clear. Soft palate rises symmetrically and there is no tongue deviation. Hearing is intact to conversational tone. Tone: Tone is good throughout. Sensation: Sensation is intact to light touch throughout Coordination:  The patient has no difficulty with RAM's or FNF bilaterally.  Mild postural tremor.  Mild intention tremor Motor: Strength is 5/5 in the bilateral upper and lower extremities.  Shoulder shrug is equal and symmetric. There is no pronator drift.  There are no fasciculations noted. Gait and Station: The patient is able to ambulate without difficulty.     Total time spent on today's visit was 45 minutes, including both face-to-face time and nonface-to-face time.  Time included that spent on review of records (prior notes available to me/labs/imaging if pertinent), discussing treatment and goals, answering patient's questions and coordinating care.   Cc:  Adrian Prince, MD

## 2022-08-18 ENCOUNTER — Other Ambulatory Visit (HOSPITAL_COMMUNITY): Payer: Self-pay

## 2022-08-18 ENCOUNTER — Telehealth: Payer: Self-pay | Admitting: Pharmacy Technician

## 2022-08-18 ENCOUNTER — Ambulatory Visit: Payer: Commercial Managed Care - HMO | Admitting: Neurology

## 2022-08-18 ENCOUNTER — Encounter: Payer: Self-pay | Admitting: Neurology

## 2022-08-18 VITALS — BP 124/86 | HR 88 | Ht 63.0 in | Wt 151.6 lb

## 2022-08-18 DIAGNOSIS — G243 Spasmodic torticollis: Secondary | ICD-10-CM

## 2022-08-18 NOTE — Patient Instructions (Signed)
We will call you to schedule once we hear from your insurance.  If you don't hear from [place] in about 2-3 weeks, feel free to call and ask [place] about the status.  The physicians and staff at [facility] are committed to providing excellent care. You may receive a survey requesting feedback about your experience at our office. We strive to receive "very good" responses to the survey questions. If you feel that your experience would prevent you from giving the office a "very good " response, please contact our office to try to remedy the situation. We may be reached at [phone]. Thank you for taking the time out of your busy day to complete the survey.

## 2022-08-18 NOTE — Telephone Encounter (Addendum)
ADDITIONAL INFORMATION SENT VIA FAX TO [phone]  [employer]otox[employer] verification has been submitted. Benefit Verification:   [id number]   Pharmacy PA has been submitted for BOTOX 200 via CMM. INSURANCE: [employer] DATE SUBMITTED: PENDING VERIFICATION KEY:  Status is pending

## 2022-09-09 ENCOUNTER — Telehealth: Payer: Self-pay | Admitting: Neurology

## 2022-09-09 ENCOUNTER — Other Ambulatory Visit: Payer: Self-pay

## 2022-09-09 DIAGNOSIS — G243 Spasmodic torticollis: Secondary | ICD-10-CM

## 2022-09-09 MED ORDER — BOTOX 100 UNITS IJ SOLR: INTRAMUSCULAR | 0 refills | Status: AC

## 2022-09-09 NOTE — Telephone Encounter (Signed)
Left message to get patient sch for Botox  200U With Tat

## 2022-09-13 ENCOUNTER — Other Ambulatory Visit (HOSPITAL_COMMUNITY): Payer: Self-pay

## 2022-09-14 ENCOUNTER — Other Ambulatory Visit (HOSPITAL_COMMUNITY): Payer: Self-pay

## 2022-09-15 NOTE — Telephone Encounter (Signed)
Pharmacy Patient Advocate Encounter  Received notification from [employer] that the request for prior authorization for BOTOX has been denied due to     Please be advised we currently do not have a Pharmacist to review denials, therefore you will need to process appeals accordingly as needed. Thanks for your support at this time.   You may call [phone] or fax [phone], to appeal.

## 2022-09-24 ENCOUNTER — Ambulatory Visit: Payer: Commercial Managed Care - HMO | Admitting: Neurology

## 2022-09-29 ENCOUNTER — Telehealth: Payer: Self-pay | Admitting: Neurology

## 2022-09-29 NOTE — Telephone Encounter (Signed)
New message   Checking on prior authorization for botulinum toxin Type A (BOTOX) 100 units SOLR injection, asking for call back to discuss.

## 2022-09-30 NOTE — Telephone Encounter (Signed)
On 09/15/22 note was placed in pt chart that Botox was denied.    Signed      Pharmacy Patient Advocate Encounter   Received notification from [employer] that the request for prior authorization for BOTOX has been denied due to      Please be advised we currently do not have a Pharmacist to review denials, therefore you will need to process appeals accordingly as needed. Thanks for your support at this time.     You may call [phone] or fax [phone], to appeal.          Electronically signed by Ricke Hey, CPhT at 09/15/2022 12:14 PM

## 2022-10-04 NOTE — Telephone Encounter (Signed)
Called and spoke to patient about the issues we have been having with her Botox and [facility]

## 2022-12-24 ENCOUNTER — Ambulatory Visit: Payer: Commercial Managed Care - HMO | Admitting: Neurology

## 2023-02-08 ENCOUNTER — Other Ambulatory Visit: Payer: Self-pay | Admitting: Neurology

## 2023-02-08 DIAGNOSIS — G243 Spasmodic torticollis: Secondary | ICD-10-CM

## 2023-05-27 ENCOUNTER — Other Ambulatory Visit (HOSPITAL_BASED_OUTPATIENT_CLINIC_OR_DEPARTMENT_OTHER): Payer: Self-pay | Admitting: Obstetrics & Gynecology

## 2023-05-27 DIAGNOSIS — R232 Flushing: Secondary | ICD-10-CM

## 2023-06-13 ENCOUNTER — Ambulatory Visit (HOSPITAL_BASED_OUTPATIENT_CLINIC_OR_DEPARTMENT_OTHER): Payer: Managed Care, Other (non HMO) | Admitting: Obstetrics & Gynecology

## 2023-08-03 ENCOUNTER — Encounter (HOSPITAL_BASED_OUTPATIENT_CLINIC_OR_DEPARTMENT_OTHER): Payer: Self-pay | Admitting: *Deleted

## 2023-09-12 ENCOUNTER — Encounter (HOSPITAL_BASED_OUTPATIENT_CLINIC_OR_DEPARTMENT_OTHER): Payer: Self-pay | Admitting: Obstetrics & Gynecology

## 2023-09-12 ENCOUNTER — Ambulatory Visit (HOSPITAL_BASED_OUTPATIENT_CLINIC_OR_DEPARTMENT_OTHER): Payer: Commercial Managed Care - HMO | Admitting: Obstetrics & Gynecology

## 2023-09-12 VITALS — BP 159/85 | HR 72 | Ht 63.0 in | Wt 153.0 lb

## 2023-09-12 DIAGNOSIS — Z803 Family history of malignant neoplasm of breast: Secondary | ICD-10-CM

## 2023-09-12 DIAGNOSIS — Z01411 Encounter for gynecological examination (general) (routine) with abnormal findings: Secondary | ICD-10-CM | POA: Diagnosis not present

## 2023-09-12 DIAGNOSIS — Z90710 Acquired absence of both cervix and uterus: Secondary | ICD-10-CM

## 2023-09-12 DIAGNOSIS — D0511 Intraductal carcinoma in situ of right breast: Secondary | ICD-10-CM

## 2023-09-12 DIAGNOSIS — Z1401 Asymptomatic hemophilia A carrier: Secondary | ICD-10-CM

## 2023-09-12 DIAGNOSIS — R232 Flushing: Secondary | ICD-10-CM | POA: Diagnosis not present

## 2023-09-12 DIAGNOSIS — Z01419 Encounter for gynecological examination (general) (routine) without abnormal findings: Secondary | ICD-10-CM

## 2023-09-12 DIAGNOSIS — M810 Age-related osteoporosis without current pathological fracture: Secondary | ICD-10-CM

## 2023-09-12 DIAGNOSIS — I10 Essential (primary) hypertension: Secondary | ICD-10-CM

## 2023-09-12 MED ORDER — VENLAFAXINE HCL ER 75 MG PO CP24: 75.0000 mg | ORAL_CAPSULE | Freq: Every day | ORAL | 3 refills | Status: AC

## 2023-09-12 NOTE — Progress Notes (Signed)
 ANNUAL EXAM Patient name: Michelle Fernandez MRN [id number]  Date of birth: March 31, 1961 Chief Complaint:   Annual Exam  History of Present Illness:   Michelle Fernandez is a 63 y.o. G2P2 Caucasian female being seen today for a routine annual exam. Having some head tremors and has seen Dr. Winferd Hatter.  Botox  injections recommended.  Insurance won't cover it right now but going on medicare in November and they will reassess at that time.  She is going to schedule f/u with Dr. Winferd Hatter.    H/o TVH.    Seeing Dr. Jesse Moritz at the end of the month.  Has blood work scheduled the week before the appointment.    Patient's last menstrual period was 05/03/1986.   Last pap  03/18/2015. Results were: negative.  Last mammogram: 03/24/2023. Results were: normal. Family h/o breast cancer: yes mother and maternal aunt Last colonoscopy:  cologuard negative 07/2021.  Due next year and I will order this  DEXA;  2022 t score -2.7     09/12/2023   10:42 AM 06/02/2021   10:02 AM 04/18/2017    8:20 AM  Depression screen PHQ 2/9  Decreased Interest 0 0 0  Down, Depressed, Hopeless 0 0 0  PHQ - 2 Score 0 0 0    Review of Systems:   Pertinent items are noted in HPI  Denies any any urinary or bowel changes.   Pertinent History Reviewed:  Reviewed past medical,surgical, social and family history.  Reviewed problem list, medications and allergies. Physical Assessment:   Vitals:   09/12/23 1039  BP: (!) 159/85  Pulse: 72  Weight: 153 lb (69.4 kg)  Height: 5\' 3"  (1.6 m)  Body mass index is 27.1 kg/m.        Physical Examination:   General appearance - well appearing, and in no distress  Mental status - alert, oriented to person, place, and time  Psych:  She has a normal mood and affect  Skin - warm and dry, normal color, no suspicious lesions noted  Chest - effort normal, all lung fields clear to auscultation bilaterally  Heart - normal rate and regular rhythm  Neck:  midline trachea, no thyromegaly or  nodules  Breasts - breasts appear normal, no suspicious masses, no skin or nipple changes or  axillary nodes  Abdomen - soft, nontender, nondistended, no masses or organomegaly  Pelvic - VULVA: normal appearing vulva with no masses, tenderness or lesions    VAGINA: normal appearing vagina with normal color and discharge, no lesions    CERVIX: surgically absent  Thin prep pap is not indicated today  UTERUS: surgically absent  ADNEXA: No adnexal masses or tenderness noted.  Rectal - normal rectal, good sphincter tone, no masses felt.   Extremities:  No swelling or varicosities noted  Chaperone present for exam  Assessment & Plan:  1. Well woman exam with routine gynecological exam (Primary) - Pap smear not indicated - Mammogram 03/2023 - Colonoscopy declined.  Cologuard negative in 2023. - Bone mineral density 2022.  Osteoporosis was present.  She does not want treatment - lab work done with PCP, Dr. Felipe Horton - vaccines reviewed/updated  2. Hot flashes - venlafaxine  XR (EFFEXOR -XR) 75 MG 24 hr capsule; Take 1 capsule (75 mg total) by mouth daily with breakfast.  Dispense: 90 capsule; Refill: 3  3. Age-related osteoporosis without current pathological fracture  4. Ductal carcinoma in situ (DCIS) of right breast - doing yearly mammogram - s/p radiation in 2019  5. Family  history of breast cancer - negative genetic testing  6. H/O total vaginal hysterectomy, endometriosis 1988  7. Primary hypertension - followed by Dr. Jesse Moritz.  Has appt end of the moth  8. 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   Meds:  Meds ordered this encounter  Medications   venlafaxine  XR (EFFEXOR -XR) 75 MG 24 hr capsule    Sig: Take 1 capsule (75 mg total) by mouth daily with breakfast.    Dispense:  90 capsule    Refill:  3    Follow-up: Return in about 1 year (around 09/11/2024).  Lillian Rein, MD 09/12/2023 11:33 AM

## 2024-04-02 ENCOUNTER — Encounter (HOSPITAL_BASED_OUTPATIENT_CLINIC_OR_DEPARTMENT_OTHER): Payer: Self-pay

## 2024-04-02 DIAGNOSIS — Z1231 Encounter for screening mammogram for malignant neoplasm of breast: Secondary | ICD-10-CM | POA: Diagnosis not present

## 2024-09-14 ENCOUNTER — Ambulatory Visit (HOSPITAL_BASED_OUTPATIENT_CLINIC_OR_DEPARTMENT_OTHER): Admitting: Obstetrics & Gynecology
